# Patient Record
Sex: Female | Born: 1935 | Hispanic: Yes | Smoking: Never smoker
Health system: Southern US, Community
[De-identification: ages and names within clinical notes are randomized; demographics above are authoritative.]

## PROBLEM LIST (undated history)

## (undated) DIAGNOSIS — I10 Essential (primary) hypertension: Secondary | ICD-10-CM

## (undated) DIAGNOSIS — K219 Gastro-esophageal reflux disease without esophagitis: Secondary | ICD-10-CM

## (undated) DIAGNOSIS — G3184 Mild cognitive impairment, so stated: Secondary | ICD-10-CM

## (undated) DIAGNOSIS — C50919 Malignant neoplasm of unspecified site of unspecified female breast: Secondary | ICD-10-CM

## (undated) DIAGNOSIS — E7800 Pure hypercholesterolemia, unspecified: Secondary | ICD-10-CM

## (undated) HISTORY — PX: BREAST EXCISIONAL BIOPSY: SUR124

## (undated) HISTORY — PX: CATARACT EXTRACTION W/ INTRAOCULAR LENS  IMPLANT, BILATERAL: SHX1307

## (undated) HISTORY — PX: APPENDECTOMY: SHX54

## (undated) HISTORY — DX: Mild cognitive impairment of uncertain or unknown etiology: G31.84

## (undated) HISTORY — PX: TONSILLECTOMY: SUR1361

## (undated) HISTORY — PX: BUNIONECTOMY: SHX129

---

## 1978-01-17 DIAGNOSIS — C50919 Malignant neoplasm of unspecified site of unspecified female breast: Secondary | ICD-10-CM

## 1978-01-17 HISTORY — DX: Malignant neoplasm of unspecified site of unspecified female breast: C50.919

## 1988-01-18 HISTORY — PX: ABDOMINAL HYSTERECTOMY: SHX81

## 2010-01-18 LAB — HM COLONOSCOPY: HM Colonoscopy: NORMAL

## 2011-01-18 HISTORY — PX: ESOPHAGOGASTRODUODENOSCOPY: SHX1529

## 2013-08-26 LAB — LIPID PANEL
Cholesterol: 273 mg/dL — AB (ref 0–200)
HDL: 73 mg/dL — AB (ref 35–70)
LDL Cholesterol: 163 mg/dL
Triglycerides: 183 mg/dL — AB (ref 40–160)

## 2013-08-26 LAB — BASIC METABOLIC PANEL
BUN: 24 mg/dL — AB (ref 4–21)
Creatinine: 1.2 mg/dL — AB (ref <=1.1)

## 2013-08-26 LAB — TSH: TSH: 2.6 u[IU]/mL (ref <=5.90)

## 2013-09-05 ENCOUNTER — Ambulatory Visit: Payer: Self-pay | Admitting: Internal Medicine

## 2013-09-05 LAB — HM MAMMOGRAPHY: HM Mammogram: NORMAL

## 2014-01-22 ENCOUNTER — Ambulatory Visit: Payer: Self-pay | Admitting: Internal Medicine

## 2014-01-22 DIAGNOSIS — M810 Age-related osteoporosis without current pathological fracture: Secondary | ICD-10-CM | POA: Diagnosis not present

## 2014-01-22 DIAGNOSIS — M8580 Other specified disorders of bone density and structure, unspecified site: Secondary | ICD-10-CM | POA: Diagnosis not present

## 2014-01-22 DIAGNOSIS — Z13820 Encounter for screening for osteoporosis: Secondary | ICD-10-CM | POA: Diagnosis not present

## 2014-02-12 DIAGNOSIS — M810 Age-related osteoporosis without current pathological fracture: Secondary | ICD-10-CM | POA: Diagnosis not present

## 2014-02-12 DIAGNOSIS — I10 Essential (primary) hypertension: Secondary | ICD-10-CM | POA: Diagnosis not present

## 2014-02-12 DIAGNOSIS — H6123 Impacted cerumen, bilateral: Secondary | ICD-10-CM | POA: Diagnosis not present

## 2014-02-12 DIAGNOSIS — H918X3 Other specified hearing loss, bilateral: Secondary | ICD-10-CM | POA: Diagnosis not present

## 2014-02-17 DIAGNOSIS — H6120 Impacted cerumen, unspecified ear: Secondary | ICD-10-CM | POA: Diagnosis not present

## 2014-02-17 DIAGNOSIS — H903 Sensorineural hearing loss, bilateral: Secondary | ICD-10-CM | POA: Diagnosis not present

## 2014-03-10 DIAGNOSIS — M150 Primary generalized (osteo)arthritis: Secondary | ICD-10-CM | POA: Diagnosis not present

## 2014-03-10 DIAGNOSIS — G8929 Other chronic pain: Secondary | ICD-10-CM | POA: Diagnosis not present

## 2014-03-10 DIAGNOSIS — M25562 Pain in left knee: Secondary | ICD-10-CM | POA: Diagnosis not present

## 2014-03-10 DIAGNOSIS — M810 Age-related osteoporosis without current pathological fracture: Secondary | ICD-10-CM | POA: Diagnosis not present

## 2014-04-28 DIAGNOSIS — M810 Age-related osteoporosis without current pathological fracture: Secondary | ICD-10-CM | POA: Diagnosis not present

## 2014-06-06 DIAGNOSIS — K2270 Barrett's esophagus without dysplasia: Secondary | ICD-10-CM | POA: Diagnosis not present

## 2014-06-06 DIAGNOSIS — I10 Essential (primary) hypertension: Secondary | ICD-10-CM | POA: Diagnosis not present

## 2014-06-06 DIAGNOSIS — Z23 Encounter for immunization: Secondary | ICD-10-CM | POA: Diagnosis not present

## 2014-06-06 DIAGNOSIS — E782 Mixed hyperlipidemia: Secondary | ICD-10-CM | POA: Diagnosis not present

## 2014-06-17 ENCOUNTER — Ambulatory Visit
Admission: EM | Admit: 2014-06-17 | Discharge: 2014-06-17 | Disposition: A | Discharge status: Home / Self Care | Payer: Medicare Other | Attending: Family Medicine | Admitting: Family Medicine

## 2014-06-17 ENCOUNTER — Other Ambulatory Visit: Payer: Self-pay

## 2014-06-17 DIAGNOSIS — M25512 Pain in left shoulder: Secondary | ICD-10-CM | POA: Diagnosis not present

## 2014-06-17 DIAGNOSIS — K219 Gastro-esophageal reflux disease without esophagitis: Secondary | ICD-10-CM | POA: Insufficient documentation

## 2014-06-17 DIAGNOSIS — I10 Essential (primary) hypertension: Secondary | ICD-10-CM | POA: Diagnosis not present

## 2014-06-17 DIAGNOSIS — Z79899 Other long term (current) drug therapy: Secondary | ICD-10-CM | POA: Diagnosis not present

## 2014-06-17 DIAGNOSIS — E780 Pure hypercholesterolemia: Secondary | ICD-10-CM | POA: Diagnosis not present

## 2014-06-17 DIAGNOSIS — S46912A Strain of unspecified muscle, fascia and tendon at shoulder and upper arm level, left arm, initial encounter: Secondary | ICD-10-CM

## 2014-06-17 HISTORY — DX: Pure hypercholesterolemia, unspecified: E78.00

## 2014-06-17 HISTORY — DX: Essential (primary) hypertension: I10

## 2014-06-17 HISTORY — DX: Gastro-esophageal reflux disease without esophagitis: K21.9

## 2014-06-17 NOTE — ED Notes (Signed)
States woke Sunday Morning with left upper arm pain. Denied chest pain. No diaphoretic or SOB. Color pink, skin warm and dry

## 2014-06-17 NOTE — ED Provider Notes (Signed)
CSN: [id number]     Arrival date & time 06/17/14  1033 History   First MD Initiated Contact with Patient 06/17/14 1131     Chief Complaint  Patient presents with  . Arm Pain   (Consider location/radiation/quality/duration/timing/severity/associated sxs/prior Treatment) Patient is a 79 y.o. female presenting with arm pain. The history is provided by the patient.  Arm Pain This is a new problem. The current episode started 2 days ago (complains of waking up on Sunday with left shoulder pain which was worse with movement; states has improved some since then but not resolved; denies any chest pain or shortness of breath). The problem occurs constantly. Pertinent negatives include no chest pain, no abdominal pain, no headaches and no shortness of breath. She has tried nothing for the symptoms.    Past Medical History  Diagnosis Date  . GERD (gastroesophageal reflux disease)   . Hypertension   . Hypercholesteremia    Past Surgical History  Procedure Laterality Date  . Appendectomy    . Abdominal hysterectomy     No family history on file. History  Substance Use Topics  . Smoking status: Never Smoker   . Smokeless tobacco: Not on file  . Alcohol Use: No   OB History    No data available     Review of Systems  Respiratory: Negative for shortness of breath.   Cardiovascular: Negative for chest pain.  Gastrointestinal: Negative for abdominal pain.  Neurological: Negative for headaches.    Allergies  Review of patient's allergies indicates no known allergies.  Home Medications   Prior to Admission medications   Medication Sig Start Date End Date Taking? Authorizing Provider  ezetimibe (ZETIA) 10 MG tablet Take 10 mg by mouth daily.   Yes Historical Provider, MD  losartan-hydrochlorothiazide (HYZAAR) 100-12.5 MG per tablet Take 1 tablet by mouth daily.   Yes Historical Provider, MD  omeprazole (PRILOSEC) 20 MG capsule Take 20 mg by mouth daily.   Yes Historical Provider, MD    BP 154/78 mmHg  Pulse 75  Temp(Src) 97.3 F (36.3 C) (Tympanic)  Resp 17  Ht 5\' 2"  (1.575 m)  Wt 153 lb (69.4 kg)  BMI 27.98 kg/m2  SpO2 97% Physical Exam  Constitutional: She appears well-developed and well-nourished. No distress.  Cardiovascular: Normal rate, regular rhythm and normal heart sounds.   No murmur heard. Pulmonary/Chest: Effort normal and breath sounds normal. No respiratory distress. She has no wheezes. She has no rales. She exhibits no tenderness.  Musculoskeletal:       Left shoulder: She exhibits decreased range of motion (90 degree abduction), tenderness and pain (with movement (abduction)). She exhibits no bony tenderness, no swelling, no effusion, no crepitus, no deformity, no laceration, normal pulse and normal strength.  Skin: She is not diaphoretic.  Nursing note and vitals reviewed.   ED Course  Procedures (including critical care time) Labs Review Labs Reviewed - No data to display  Imaging Review No results found.  EKG: there are no previous tracings available for comparison, normal sinus rhythm, Q waves in III, V1, V2; reviewed by me and agree with computerized readout MDM   1. Left shoulder strain, initial encounter     Plan: 1. EKG results and diagnosis reviewed with patient 2. rx as per orders; risks, benefits, potential side effects reviewed with patient 3. Recommend supportive treatment with ROM exercises, ice, otc NSAIDS; recommend 81mg  ASA preventative due to risks factors 4. F/u with PCP for possible further work up regarding EKG findings;  Norval Gable, MD 06/17/14 [phone]

## 2014-06-21 ENCOUNTER — Encounter: Payer: Self-pay | Admitting: Internal Medicine

## 2014-06-21 ENCOUNTER — Other Ambulatory Visit: Payer: Self-pay | Admitting: Internal Medicine

## 2014-06-21 DIAGNOSIS — M1710 Unilateral primary osteoarthritis, unspecified knee: Secondary | ICD-10-CM | POA: Insufficient documentation

## 2014-06-21 DIAGNOSIS — N6019 Diffuse cystic mastopathy of unspecified breast: Secondary | ICD-10-CM | POA: Insufficient documentation

## 2014-06-21 DIAGNOSIS — K2270 Barrett's esophagus without dysplasia: Secondary | ICD-10-CM | POA: Insufficient documentation

## 2014-06-21 DIAGNOSIS — M810 Age-related osteoporosis without current pathological fracture: Secondary | ICD-10-CM | POA: Insufficient documentation

## 2014-06-21 DIAGNOSIS — E782 Mixed hyperlipidemia: Secondary | ICD-10-CM | POA: Insufficient documentation

## 2014-06-21 DIAGNOSIS — M179 Osteoarthritis of knee, unspecified: Secondary | ICD-10-CM | POA: Insufficient documentation

## 2014-06-21 DIAGNOSIS — I10 Essential (primary) hypertension: Secondary | ICD-10-CM | POA: Insufficient documentation

## 2014-06-21 DIAGNOSIS — G4700 Insomnia, unspecified: Secondary | ICD-10-CM | POA: Insufficient documentation

## 2014-08-28 ENCOUNTER — Encounter: Payer: Self-pay | Admitting: Internal Medicine

## 2014-08-29 ENCOUNTER — Encounter: Payer: Self-pay | Admitting: Family Medicine

## 2014-08-29 ENCOUNTER — Ambulatory Visit (INDEPENDENT_AMBULATORY_CARE_PROVIDER_SITE_OTHER): Payer: Medicare Other | Admitting: Family Medicine

## 2014-08-29 VITALS — BP 130/78 | HR 80 | Ht 62.0 in | Wt 150.0 lb

## 2014-08-29 DIAGNOSIS — M5431 Sciatica, right side: Secondary | ICD-10-CM

## 2014-08-29 DIAGNOSIS — M533 Sacrococcygeal disorders, not elsewhere classified: Secondary | ICD-10-CM | POA: Diagnosis not present

## 2014-08-29 MED ORDER — PREDNISONE 10 MG PO TABS: 10.0000 mg | ORAL_TABLET | Freq: Every day | ORAL | Status: DC
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
## 2014-08-29 NOTE — Patient Instructions (Signed)
Sacroiliac Joint Dysfunction The sacroiliac joint connects the lower part of the spine (the sacrum) with the bones of the pelvis. CAUSES  Sometimes, there is no obvious reason for sacroiliac joint dysfunction. Other times, it may occur   During pregnancy.  After injury, such as:  Car accidents.  Sport-related injuries.  Work-related injuries.  Due to one leg being shorter than the other.  Due to other conditions that affect the joints, such as:  Rheumatoid arthritis.  Gout.   Psoriasis.  Joint infection (septic arthritis). SYMPTOMS  Symptoms may include:  Pain in the:  Lower back.  Buttocks.  Groin.  Thighs and legs.  Difficult sitting, standing, walking, lying, bending or lifting. DIAGNOSIS  A number of tests may be used to help diagnose the cause of sacroiliac joint dysfunction, including:  Imaging tests to look for other causes of pain, including:  MRI.  CT scan.  Bone scan.  Diagnostic injection: During a special x-ray (called fluoroscopy), a needle is put into the sacroiliac joint. A numbing medicine is injected into the joint. If the pain is improved or stopped, the diagnosis of sacroiliac joint dysfunction is more likely. TREATMENT  There are a number of types of treatment used for sacroiliac joint dysfunction, including:  Only take over-the-counter or prescription medicines for pain, discomfort, or fever as directed by your caregiver.  Medications to relax muscles.  Rest. Decreasing activity can help cut down on painful muscle spasms and allow the back to heal.  Application of heat or ice to the lower back may improve muscle spasms and soothe pain.  Brace. A special back brace, called a sacroiliac belt, can help support the joint while your back is healing.  Physical therapy can help teach comfortable positions and exercises to strengthen muscles that support the sacroiliac joint.  Cortisone injections. Injections of steroid medicine into the  joint can help decrease swelling and improve pain.  Hyaluronic acid injections. This chemical improves lubrication within the sacroiliac joint, thereby decreasing pain.  Radiofrequency ablation. A special needle is placed into the joint, where it burns away nerves that are carrying pain messages from the joint.  Surgery. Because pain occurs during movement of the joint, screws and plates may be installed in order to limit or prevent joint motion. HOME CARE INSTRUCTIONS   Take all medications exactly as directed.  Follow instructions regarding both rest and physical activity, to avoid worsening the pain.  Do physical therapy exercises exactly as prescribed. SEEK IMMEDIATE MEDICAL CARE IF:  You experience increasingly severe pain.  You develop new symptoms, such as numbness or tingling in your legs or feet.  You lose bladder or bowel control. Document Released: 04/01/2008 Document Revised: 03/28/2011 Document Reviewed: 04/01/2008 [facility] Patient Information 2015 [place], [place]. This information is not intended to replace advice given to you by your health care provider. Make sure you discuss any questions you have with your health care provider.

## 2014-10-20 ENCOUNTER — Encounter: Payer: Self-pay | Admitting: Internal Medicine

## 2014-10-20 ENCOUNTER — Ambulatory Visit (INDEPENDENT_AMBULATORY_CARE_PROVIDER_SITE_OTHER): Payer: Medicare Other | Admitting: Internal Medicine

## 2014-10-20 VITALS — BP 122/70 | HR 76 | Ht 62.0 in | Wt 157.0 lb

## 2014-10-20 DIAGNOSIS — I10 Essential (primary) hypertension: Secondary | ICD-10-CM

## 2014-10-20 DIAGNOSIS — Z23 Encounter for immunization: Secondary | ICD-10-CM | POA: Diagnosis not present

## 2014-10-20 DIAGNOSIS — E782 Mixed hyperlipidemia: Secondary | ICD-10-CM | POA: Diagnosis not present

## 2014-10-20 DIAGNOSIS — Z1239 Encounter for other screening for malignant neoplasm of breast: Secondary | ICD-10-CM | POA: Diagnosis not present

## 2014-10-20 DIAGNOSIS — M1712 Unilateral primary osteoarthritis, left knee: Secondary | ICD-10-CM | POA: Diagnosis not present
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
## 2014-10-24 DIAGNOSIS — M25562 Pain in left knee: Secondary | ICD-10-CM | POA: Diagnosis not present

## 2014-10-27 ENCOUNTER — Encounter: Payer: Self-pay | Admitting: Internal Medicine

## 2014-10-27 ENCOUNTER — Encounter: Payer: Medicare Other | Admitting: Internal Medicine

## 2014-10-28 ENCOUNTER — Encounter: Payer: Self-pay | Admitting: Internal Medicine

## 2014-10-28 ENCOUNTER — Ambulatory Visit (INDEPENDENT_AMBULATORY_CARE_PROVIDER_SITE_OTHER): Payer: Medicare Other | Admitting: Internal Medicine

## 2014-10-28 VITALS — BP 148/78 | HR 68 | Ht 62.0 in | Wt 155.8 lb

## 2014-10-28 DIAGNOSIS — N952 Postmenopausal atrophic vaginitis: Secondary | ICD-10-CM

## 2014-10-28 DIAGNOSIS — I10 Essential (primary) hypertension: Secondary | ICD-10-CM | POA: Diagnosis not present

## 2014-10-28 DIAGNOSIS — K2270 Barrett's esophagus without dysplasia: Secondary | ICD-10-CM

## 2014-10-28 DIAGNOSIS — E782 Mixed hyperlipidemia: Secondary | ICD-10-CM

## 2014-10-28 DIAGNOSIS — N6019 Diffuse cystic mastopathy of unspecified breast: Secondary | ICD-10-CM | POA: Diagnosis not present

## 2014-10-28 DIAGNOSIS — M170 Bilateral primary osteoarthritis of knee: Secondary | ICD-10-CM | POA: Diagnosis not present

## 2014-10-28 LAB — POCT URINALYSIS DIPSTICK
Bilirubin, UA: NEGATIVE
Blood, UA: NEGATIVE
Glucose, UA: NEGATIVE
Ketones, UA: NEGATIVE
Leukocytes, UA: NEGATIVE
Nitrite, UA: NEGATIVE
Protein, UA: NEGATIVE
Spec Grav, UA: 1.01
Urobilinogen, UA: 0.2
pH, UA: 5

## 2014-10-28 MED ORDER — EZETIMIBE 10 MG PO TABS: 10.0000 mg | ORAL_TABLET | Freq: Every day | ORAL | Status: DC

## 2014-10-28 MED ORDER — OMEPRAZOLE 40 MG PO CPDR: 40.0000 mg | DELAYED_RELEASE_CAPSULE | Freq: Every day | ORAL | Status: DC

## 2014-10-28 MED ORDER — ESTROGENS, CONJUGATED 0.625 MG/GM VA CREA: 0.2500 | TOPICAL_CREAM | VAGINAL | Status: DC

## 2014-10-28 MED ORDER — HYDROCHLOROTHIAZIDE 12.5 MG PO TABS: 12.5000 mg | ORAL_TABLET | Freq: Every day | ORAL | Status: DC

## 2014-10-28 MED ORDER — LOSARTAN POTASSIUM 100 MG PO TABS: 100.0000 mg | ORAL_TABLET | Freq: Every day | ORAL | Status: DC

## 2014-10-28 NOTE — Progress Notes (Signed)
Patient: Evelyn Clements, Female    DOB: 03/10/1935, 79 y.o.   MRN: [id number] Visit Date: 10/28/2014  Today's Provider: Halina Maidens, MD   Chief Complaint  Patient presents with  . Medicare Wellness   Subjective:    Annual wellness visit Evelyn Clements is a 79 y.o. female who presents today for her Subsequent Annual Wellness Visit. She feels well. She reports exercising intermittently. She reports she is sleeping well. She denies any breast mass or tenderness.  She is due for mammogram - order has already been placed.  ----------------------------------------------------------- Hypertension This is a chronic problem. The current episode started more than 1 year ago. The problem is unchanged. The problem is controlled. Pertinent negatives include no chest pain, headaches, palpitations or shortness of breath. There are no associated agents to hypertension. Past treatments include angiotensin blockers and diuretics. The current treatment provides significant improvement. There are no compliance problems.  There is no history of kidney disease, CAD/MI or PVD. There is no history of chronic renal disease.  Hyperlipidemia This is a chronic problem. The problem is controlled. Recent lipid tests were reviewed and are normal. She has no history of chronic renal disease or diabetes. Pertinent negatives include no chest pain or shortness of breath. Current antihyperlipidemic treatment includes ezetimibe. The current treatment provides moderate improvement of lipids. There are no compliance problems.   Knee Pain - patient has ongoing left knee pain primarily. When a soft brace for support. She's been seen by orthopedics and they have recommended physical therapy.  Vaginal discomfort - Patient has noticed some mild vaginal irritation and some lesions on the left buttock over the past couple of months. She denies any vaginal bleeding or discharge. No pain with urination. She is not sexually active.  Review of  Systems  Constitutional: Negative for fever, diaphoresis, appetite change and fatigue.  HENT: Negative for ear discharge, ear pain, hearing loss, sore throat and trouble swallowing.   Eyes: Negative for visual disturbance.  Respiratory: Negative for cough, chest tightness and shortness of breath.   Cardiovascular: Negative for chest pain, palpitations and leg swelling.  Gastrointestinal: Negative for abdominal pain, constipation and blood in stool.  Endocrine: Negative for polydipsia and polyuria.  Genitourinary: Positive for genital sores. Negative for dysuria, hematuria, vaginal bleeding and vaginal discharge.  Musculoskeletal: Positive for arthralgias and gait problem. Negative for back pain.  Skin: Negative for color change and rash.  Neurological: Negative for seizures, weakness, light-headedness, numbness and headaches.  Hematological: Negative for adenopathy.  Psychiatric/Behavioral: Negative for sleep disturbance, dysphoric mood and decreased concentration.    Social History   Social History  . Marital Status: Single    Spouse Name: N/A  . Number of Children: N/A  . Years of Education: N/A   Occupational History  . Not on file.   Social History Main Topics  . Smoking status: Never Smoker   . Smokeless tobacco: Not on file  . Alcohol Use: No  . Drug Use: Not on file  . Sexual Activity: No   Other Topics Concern  . Not on file   Social History Narrative    Patient Active Problem List   Diagnosis Date Noted  . Barrett esophagus 06/21/2014  . Essential (primary) hypertension 06/21/2014  . Fibrocystic breast 06/21/2014  . Insomnia, persistent 06/21/2014  . Hyperlipidemia, mixed 06/21/2014  . OP (osteoporosis) 06/21/2014  . Arthritis of knee, degenerative 06/21/2014    Past Surgical History  Procedure Laterality Date  . Appendectomy    . Abdominal hysterectomy  1990    Total  . Tonsillectomy    . Breast biopsy Right   . Bunionectomy Right   .  Esophagogastroduodenoscopy  2013    Barrett's esophagus    Her family history includes Leukemia in her father; Uterine cancer in her mother.    Previous Medications   BIOFLAVONOID PRODUCTS (VITAMIN C) CHEW    Chew 1 tablet by mouth daily.   CALCIUM CARB-CHOLECALCIFEROL (CALCIUM + D3) 600-200 MG-UNIT TABS    Take 1 tablet by mouth daily.   EZETIMIBE (ZETIA) 10 MG TABLET    Take 10 mg by mouth daily.   HYDROCHLOROTHIAZIDE (HYDRODIURIL) 12.5 MG TABLET    Take 1 tablet by mouth daily.   LOSARTAN (COZAAR) 100 MG TABLET    Take 1 tablet by mouth daily.   MULTIPLE VITAMIN PO    Take 1 tablet by mouth daily.   OMEPRAZOLE (PRILOSEC) 40 MG CAPSULE    Take 1 capsule by mouth daily.    Patient Care Team: Glean Hess, MD as PCP - General (Family Medicine)     Objective:   Vitals: BP 148/78 mmHg  Pulse 68  Ht 5\' 2"  (1.575 m)  Wt 155 lb 12.8 oz (70.67 kg)  BMI 28.49 kg/m2  Physical Exam  Constitutional: She is oriented to person, place, and time. She appears well-developed and well-nourished. No distress.  HENT:  Head: Normocephalic and atraumatic.  Right Ear: Tympanic membrane and ear canal normal.  Left Ear: Tympanic membrane and ear canal normal.  Nose: Right sinus exhibits no maxillary sinus tenderness. Left sinus exhibits no maxillary sinus tenderness.  Mouth/Throat: Uvula is midline and oropharynx is clear and moist.  Eyes: Conjunctivae and EOM are normal. Right eye exhibits no discharge. Left eye exhibits no discharge. No scleral icterus.  Neck: Normal range of motion. Carotid bruit is not present. No erythema present. No thyromegaly present.  Cardiovascular: Normal rate, regular rhythm, normal heart sounds and normal pulses.   Pulmonary/Chest: Effort normal. No respiratory distress. She has no wheezes. Right breast exhibits inverted nipple. Right breast exhibits no mass, no nipple discharge, no skin change and no tenderness. Left breast exhibits no inverted nipple, no mass, no  nipple discharge, no skin change and no tenderness.  Abdominal: Soft. Bowel sounds are normal. There is no hepatosplenomegaly. There is no tenderness. There is no CVA tenderness.  Genitourinary:    Right adnexum displays no mass, no tenderness and no fullness. Left adnexum displays no mass, no tenderness and no fullness.  Musculoskeletal:       Left knee: She exhibits decreased range of motion. She exhibits no swelling and no effusion.  Lymphadenopathy:    She has no cervical adenopathy.    She has no axillary adenopathy.  Neurological: She is alert and oriented to person, place, and time. She has normal reflexes. No cranial nerve deficit or sensory deficit.  Skin: Skin is warm, dry and intact. No rash noted.  Psychiatric: She has a normal mood and affect. Her speech is normal and behavior is normal. Judgment and thought content normal. Cognition and memory are normal.  Nursing note and vitals reviewed.   Activities of Daily Living In your present state of health, do you have any difficulty performing the following activities: 08/29/2014  Hearing? N  Vision? N  Difficulty concentrating or making decisions? N  Walking or climbing stairs? N  Dressing or bathing? N  Doing errands, shopping? N    Fall Risk Assessment Fall Risk  08/29/2014  Falls  in the past year? No     Patient reports there are safety devices in place in shower at home.   Depression Screen PHQ 2/9 Scores 08/29/2014  PHQ - 2 Score 0    Cognitive Testing - 6-CIT   Correct? Score   What year is it? yes 0 Yes = 0    No = 4  What month is it? yes 0 Yes = 0    No = 3  Remember:     [place]ia [place], [place], [place]     What time is it? yes 0 Yes = 0    No = 3  Count backwards from 20 to 1 yes 0 Correct = 0    1 error = 2   More than 1 error = 4  Say the months of the year in reverse. yes 0 Correct = 0    1 error = 2   More than 1 error = 4  What address did I ask you to remember? yes 2 Correct = 0  1 error = 2     2 error = 4    3 error = 6    4 error = 8    All wrong = 10       TOTAL SCORE  26/28   Interpretation:  Normal  Normal (0-7) Abnormal (8-28)        Assessment & Plan:     Annual Wellness Visit  Reviewed patient's Family Medical History Reviewed and updated list of patient's medical providers Assessment of cognitive impairment was done Assessed patient's functional ability Established a written schedule for health screening [place] Completed and Reviewed  Exercise Activities and Dietary recommendations Goals    None      Immunization History  Administered Date(s) Administered  . Influenza,inj,Quad PF,36+ Mos 10/20/2014  . Influenza-Unspecified 10/17/2013  . Pneumococcal Conjugate-13 06/06/2014  . Pneumococcal Polysaccharide-23 01/19/2012  . Tdap 01/18/2010  . Zoster 01/19/2012    Health Maintenance  Topic Date Due  . DEXA SCAN  05/09/2002  . MAMMOGRAM  09/06/2014  . INFLUENZA VACCINE  08/18/2015  . TETANUS/TDAP  01/19/2020  . ZOSTAVAX  Completed  . PNA vac Low Risk Adult  Completed     Discussed health benefits of physical activity, and encouraged her to engage in regular exercise appropriate for her age and condition.    ------------------------------------------------------------------------------------------------------------  1. Essential (primary) hypertension Controlled; continue current regimen - CBC with Differential/Platelet - Comprehensive metabolic panel - TSH - POCT urinalysis dipstick - hydrochlorothiazide (HYDRODIURIL) 12.5 MG tablet; Take 1 tablet (12.5 mg total) by mouth daily.  Dispense: 30 tablet; Refill: 12 - losartan (COZAAR) 100 MG tablet; Take 1 tablet (100 mg total) by mouth daily.  Dispense: 30 tablet; Refill: 12  2. Barrett esophagus Patient advised to take daily PPI lifelong - CBC with Differential/Platelet - omeprazole (PRILOSEC) 40 MG capsule; Take 1 capsule (40 mg total) by mouth daily.  Dispense: 30  capsule; Refill: 12  3. Hyperlipidemia, mixed Intolerant of statin will continue as to maybe - Lipid panel - ezetimibe (ZETIA) 10 MG tablet; Take 1 tablet (10 mg total) by mouth daily.  Dispense: 30 tablet; Refill: 12  4. Fibrocystic breast, unspecified laterality Patient to schedule mammogram  5. Primary osteoarthritis of both knees Follow up with physical therapy as planned  6. Postmenopausal atrophic vaginitis Patient is reassured Begin estrogen cream topically - conjugated estrogens (PREMARIN) vaginal cream; Place 9.76 Applicatorfuls vaginally every other day.  Dispense: 42.5 g; Refill: 12   Halina Maidens, MD [facility]  10/28/2014

## 2014-10-28 NOTE — Patient Instructions (Signed)
Health Maintenance  Topic Date Due  . DEXA SCAN  05/09/2002  . MAMMOGRAM  09/06/2014  . INFLUENZA VACCINE  08/18/2015  . TETANUS/TDAP  01/19/2020  . ZOSTAVAX  Completed  . PNA vac Low Risk Adult  Completed

## 2014-10-29 DIAGNOSIS — M1712 Unilateral primary osteoarthritis, left knee: Secondary | ICD-10-CM | POA: Diagnosis not present

## 2014-10-29 DIAGNOSIS — M25562 Pain in left knee: Secondary | ICD-10-CM | POA: Diagnosis not present

## 2014-10-29 LAB — CBC WITH DIFFERENTIAL/PLATELET
Basophils Absolute: 0 10*3/uL (ref 0.0–0.2)
Basos: 0 %
EOS (ABSOLUTE): 0.1 10*3/uL (ref 0.0–0.4)
Eos: 2 %
Hematocrit: 40 % (ref 34.0–46.6)
Hemoglobin: 13 g/dL (ref 11.1–15.9)
Immature Grans (Abs): 0 10*3/uL (ref 0.0–0.1)
Immature Granulocytes: 0 %
Lymphocytes Absolute: 2.1 10*3/uL (ref 0.7–3.1)
Lymphs: 31 %
MCH: 29.8 pg (ref 26.6–33.0)
MCHC: 32.5 g/dL (ref 31.5–35.7)
MCV: 92 fL (ref 79–97)
Monocytes Absolute: 0.7 10*3/uL (ref 0.1–0.9)
Monocytes: 10 %
Neutrophils Absolute: 3.8 10*3/uL (ref 1.4–7.0)
Neutrophils: 57 %
Platelets: 229 10*3/uL (ref 150–379)
RBC: 4.36 x10E6/uL (ref 3.77–5.28)
RDW: 14.1 % (ref 12.3–15.4)
WBC: 6.7 10*3/uL (ref 3.4–10.8)

## 2014-10-29 LAB — COMPREHENSIVE METABOLIC PANEL
ALT: 18 IU/L (ref 0–32)
AST: 22 IU/L (ref 0–40)
Albumin/Globulin Ratio: 1.7 (ref 1.1–2.5)
Albumin: 4.1 g/dL (ref 3.5–4.8)
Alkaline Phosphatase: 67 IU/L (ref 39–117)
BUN/Creatinine Ratio: 21 (ref 11–26)
BUN: 16 mg/dL (ref 8–27)
Bilirubin Total: 0.3 mg/dL (ref 0.0–1.2)
CO2: 24 mmol/L (ref 18–29)
Calcium: 9.3 mg/dL (ref 8.7–10.3)
Chloride: 101 mmol/L (ref 97–108)
Creatinine, Ser: 0.76 mg/dL (ref 0.57–1.00)
GFR calc Af Amer: 88 mL/min/{1.73_m2} (ref >59)
GFR calc non Af Amer: 76 mL/min/{1.73_m2} (ref >59)
Globulin, Total: 2.4 g/dL (ref 1.5–4.5)
Glucose: 104 mg/dL — ABNORMAL HIGH (ref 65–99)
Potassium: 4.5 mmol/L (ref 3.5–5.2)
Sodium: 142 mmol/L (ref 134–144)
Total Protein: 6.5 g/dL (ref 6.0–8.5)

## 2014-10-29 LAB — LIPID PANEL
Chol/HDL Ratio: 4.1 ratio units (ref 0.0–4.4)
Cholesterol, Total: 256 mg/dL — ABNORMAL HIGH (ref 100–199)
HDL: 63 mg/dL (ref >39)
LDL Calculated: 165 mg/dL — ABNORMAL HIGH (ref 0–99)
Triglycerides: 138 mg/dL (ref 0–149)
VLDL Cholesterol Cal: 28 mg/dL (ref 5–40)

## 2014-10-29 LAB — TSH: TSH: 2.48 u[IU]/mL (ref 0.450–4.500)

## 2014-11-19 ENCOUNTER — Encounter: Payer: Self-pay | Admitting: Internal Medicine

## 2014-12-22 ENCOUNTER — Other Ambulatory Visit: Payer: Self-pay

## 2014-12-22 DIAGNOSIS — N952 Postmenopausal atrophic vaginitis: Secondary | ICD-10-CM

## 2014-12-22 MED ORDER — ESTROGENS, CONJUGATED 0.625 MG/GM VA CREA: 0.2500 | TOPICAL_CREAM | VAGINAL | Status: DC

## 2014-12-22 NOTE — Telephone Encounter (Signed)
Patient needs refill on Premarin and wants to know what to take for her knee pain.dr

## 2014-12-26 ENCOUNTER — Other Ambulatory Visit: Payer: Self-pay

## 2014-12-26 DIAGNOSIS — N952 Postmenopausal atrophic vaginitis: Secondary | ICD-10-CM

## 2014-12-26 MED ORDER — ESTROGENS, CONJUGATED 0.625 MG/GM VA CREA: 0.2500 | TOPICAL_CREAM | VAGINAL | Status: DC

## 2015-01-20 ENCOUNTER — Ambulatory Visit: Payer: Medicare Other | Admitting: Internal Medicine

## 2015-01-20 ENCOUNTER — Encounter: Payer: Medicare Other | Admitting: Internal Medicine

## 2015-01-21 ENCOUNTER — Other Ambulatory Visit: Payer: Self-pay

## 2015-01-21 DIAGNOSIS — I10 Essential (primary) hypertension: Secondary | ICD-10-CM

## 2015-01-21 DIAGNOSIS — K2270 Barrett's esophagus without dysplasia: Secondary | ICD-10-CM

## 2015-01-21 DIAGNOSIS — E782 Mixed hyperlipidemia: Secondary | ICD-10-CM

## 2015-01-21 MED ORDER — EZETIMIBE 10 MG PO TABS: 10.0000 mg | ORAL_TABLET | Freq: Every day | ORAL | Status: DC

## 2015-01-21 MED ORDER — OMEPRAZOLE 40 MG PO CPDR: 40.0000 mg | DELAYED_RELEASE_CAPSULE | Freq: Every day | ORAL | Status: DC

## 2015-01-21 MED ORDER — HYDROCHLOROTHIAZIDE 12.5 MG PO TABS: 12.5000 mg | ORAL_TABLET | Freq: Every day | ORAL | Status: DC

## 2015-01-21 MED ORDER — LOSARTAN POTASSIUM 100 MG PO TABS: 100.0000 mg | ORAL_TABLET | Freq: Every day | ORAL | Status: DC

## 2015-01-21 NOTE — Telephone Encounter (Signed)
Pharmacy sent Rx request.

## 2015-01-23 NOTE — Telephone Encounter (Signed)
pts coming in on 1/10

## 2015-01-27 ENCOUNTER — Ambulatory Visit: Payer: Medicare Other | Admitting: Internal Medicine

## 2015-02-03 ENCOUNTER — Ambulatory Visit (INDEPENDENT_AMBULATORY_CARE_PROVIDER_SITE_OTHER): Payer: Medicare Other | Admitting: Internal Medicine

## 2015-02-03 ENCOUNTER — Encounter: Payer: Self-pay | Admitting: Internal Medicine

## 2015-02-03 VITALS — BP 124/62 | HR 88 | Ht 62.0 in | Wt 156.0 lb

## 2015-02-03 DIAGNOSIS — E782 Mixed hyperlipidemia: Secondary | ICD-10-CM

## 2015-02-03 DIAGNOSIS — I10 Essential (primary) hypertension: Secondary | ICD-10-CM | POA: Diagnosis not present
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
## 2015-02-10 ENCOUNTER — Ambulatory Visit
Admission: RE | Admit: 2015-02-10 | Discharge: 2015-02-10 | Disposition: A | Discharge status: Home / Self Care | Payer: Medicare Other | Source: Ambulatory Visit | Attending: Internal Medicine | Admitting: Internal Medicine

## 2015-02-10 ENCOUNTER — Other Ambulatory Visit: Payer: Self-pay | Admitting: Internal Medicine

## 2015-02-10 DIAGNOSIS — Z1239 Encounter for other screening for malignant neoplasm of breast: Secondary | ICD-10-CM

## 2015-02-10 DIAGNOSIS — Z1231 Encounter for screening mammogram for malignant neoplasm of breast: Secondary | ICD-10-CM | POA: Insufficient documentation

## 2015-02-13 ENCOUNTER — Other Ambulatory Visit: Payer: Self-pay

## 2015-02-13 MED ORDER — ICOSAPENT ETHYL 1 G PO CAPS: 2.0000 | ORAL_CAPSULE | Freq: Two times a day (BID) | ORAL | Status: DC

## 2015-02-13 NOTE — Telephone Encounter (Signed)
Patient states that medication is working for her. Please send to pharmacy. [facility]

## 2015-02-19 ENCOUNTER — Telehealth: Payer: Self-pay

## 2015-02-19 NOTE — Telephone Encounter (Signed)
Patient states that she contacted her insurance company about Evelyn Clements and states that medication will cost her 225 dollars. She states that she can simply not afford this. Patient would like to know if she could try to take [facility] instead? Please advise.

## 2015-02-20 NOTE — Telephone Encounter (Signed)
Spoke with pt. Pt. Advised and will go by and pick up from [facility]

## 2015-02-20 NOTE — Telephone Encounter (Signed)
I do not expect her to pay that much for medication.  I recommend instead a high quality fish oil from [facility] instead. Evelyn Clements is not likely to be of much benefit.

## 2015-02-27 ENCOUNTER — Encounter: Payer: Self-pay | Admitting: Internal Medicine

## 2015-04-08 DIAGNOSIS — Z1283 Encounter for screening for malignant neoplasm of skin: Secondary | ICD-10-CM | POA: Diagnosis not present

## 2015-04-08 DIAGNOSIS — D485 Neoplasm of uncertain behavior of skin: Secondary | ICD-10-CM | POA: Diagnosis not present

## 2015-04-08 DIAGNOSIS — L728 Other follicular cysts of the skin and subcutaneous tissue: Secondary | ICD-10-CM | POA: Diagnosis not present

## 2015-04-08 DIAGNOSIS — L821 Other seborrheic keratosis: Secondary | ICD-10-CM | POA: Diagnosis not present

## 2015-04-08 DIAGNOSIS — R202 Paresthesia of skin: Secondary | ICD-10-CM | POA: Diagnosis not present

## 2015-04-08 DIAGNOSIS — L720 Epidermal cyst: Secondary | ICD-10-CM | POA: Diagnosis not present

## 2015-04-08 DIAGNOSIS — L814 Other melanin hyperpigmentation: Secondary | ICD-10-CM | POA: Diagnosis not present

## 2015-04-29 DIAGNOSIS — L728 Other follicular cysts of the skin and subcutaneous tissue: Secondary | ICD-10-CM | POA: Diagnosis not present

## 2015-04-29 DIAGNOSIS — L239 Allergic contact dermatitis, unspecified cause: Secondary | ICD-10-CM | POA: Diagnosis not present

## 2015-06-01 ENCOUNTER — Encounter: Payer: Self-pay | Admitting: Internal Medicine

## 2015-06-01 ENCOUNTER — Ambulatory Visit (INDEPENDENT_AMBULATORY_CARE_PROVIDER_SITE_OTHER): Payer: Medicare Other | Admitting: Internal Medicine

## 2015-06-01 VITALS — BP 143/78 | HR 74 | Temp 98.1°F | Resp 16 | Ht 62.0 in | Wt 157.0 lb

## 2015-06-01 DIAGNOSIS — L720 Epidermal cyst: Secondary | ICD-10-CM | POA: Diagnosis not present

## 2015-06-01 DIAGNOSIS — N907 Vulvar cyst: Secondary | ICD-10-CM | POA: Insufficient documentation

## 2015-06-01 DIAGNOSIS — L723 Sebaceous cyst: Secondary | ICD-10-CM | POA: Diagnosis not present
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
## 2015-06-01 NOTE — Patient Instructions (Signed)
Warm soaks as needed.  Can use gentle pressure to express material - otherwise no treatment is needed

## 2015-06-02 DIAGNOSIS — L298 Other pruritus: Secondary | ICD-10-CM | POA: Diagnosis not present

## 2015-06-02 DIAGNOSIS — D225 Melanocytic nevi of trunk: Secondary | ICD-10-CM | POA: Diagnosis not present

## 2015-06-02 DIAGNOSIS — L720 Epidermal cyst: Secondary | ICD-10-CM | POA: Diagnosis not present

## 2015-06-02 DIAGNOSIS — L02232 Carbuncle of back [any part, except buttock]: Secondary | ICD-10-CM | POA: Diagnosis not present

## 2015-06-02 DIAGNOSIS — L821 Other seborrheic keratosis: Secondary | ICD-10-CM | POA: Diagnosis not present

## 2015-06-11 ENCOUNTER — Other Ambulatory Visit: Payer: Self-pay | Admitting: Internal Medicine

## 2015-08-14 ENCOUNTER — Other Ambulatory Visit: Payer: Self-pay | Admitting: Internal Medicine

## 2015-09-10 ENCOUNTER — Ambulatory Visit: Payer: Medicare Other | Admitting: Internal Medicine

## 2015-09-17 ENCOUNTER — Other Ambulatory Visit: Payer: Self-pay

## 2015-10-02 ENCOUNTER — Ambulatory Visit (INDEPENDENT_AMBULATORY_CARE_PROVIDER_SITE_OTHER): Payer: Medicare Other | Admitting: Internal Medicine

## 2015-10-02 ENCOUNTER — Encounter: Payer: Self-pay | Admitting: Internal Medicine

## 2015-10-02 VITALS — BP 120/76 | HR 78 | Resp 16 | Ht 62.0 in | Wt 157.0 lb

## 2015-10-02 DIAGNOSIS — M5431 Sciatica, right side: Secondary | ICD-10-CM

## 2015-10-02 MED ORDER — METHYLPREDNISOLONE 4 MG PO TBPK: ORAL_TABLET | ORAL | 0 refills | Status: DC
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
## 2015-10-09 ENCOUNTER — Ambulatory Visit (INDEPENDENT_AMBULATORY_CARE_PROVIDER_SITE_OTHER): Payer: Medicare Other | Admitting: Internal Medicine

## 2015-10-09 ENCOUNTER — Encounter: Payer: Self-pay | Admitting: Internal Medicine

## 2015-10-09 VITALS — BP 138/80 | HR 64 | Resp 16 | Ht 62.0 in | Wt 154.0 lb

## 2015-10-09 DIAGNOSIS — H539 Unspecified visual disturbance: Secondary | ICD-10-CM | POA: Diagnosis not present

## 2015-10-09 DIAGNOSIS — Z0000 Encounter for general adult medical examination without abnormal findings: Secondary | ICD-10-CM

## 2015-10-09 DIAGNOSIS — E782 Mixed hyperlipidemia: Secondary | ICD-10-CM

## 2015-10-09 DIAGNOSIS — R21 Rash and other nonspecific skin eruption: Secondary | ICD-10-CM | POA: Diagnosis not present

## 2015-10-09 DIAGNOSIS — Z23 Encounter for immunization: Secondary | ICD-10-CM | POA: Diagnosis not present

## 2015-10-09 DIAGNOSIS — I10 Essential (primary) hypertension: Secondary | ICD-10-CM | POA: Diagnosis not present

## 2015-10-09 DIAGNOSIS — K2270 Barrett's esophagus without dysplasia: Secondary | ICD-10-CM | POA: Diagnosis not present

## 2015-10-09 LAB — POCT URINALYSIS DIPSTICK
Bilirubin, UA: NEGATIVE
Glucose, UA: NEGATIVE
Ketones, UA: NEGATIVE
Leukocytes, UA: NEGATIVE
Nitrite, UA: NEGATIVE
Protein, UA: NEGATIVE
Spec Grav, UA: 1.015
Urobilinogen, UA: 0.2
pH, UA: 6

## 2015-10-09 MED ORDER — NYSTATIN-TRIAMCINOLONE 100000-0.1 UNIT/GM-% EX OINT: 1.0000 "application " | TOPICAL_OINTMENT | Freq: Two times a day (BID) | CUTANEOUS | 0 refills | Status: DC

## 2015-10-09 NOTE — Patient Instructions (Signed)
Health Maintenance  Topic Date Due  . MAMMOGRAM  02/10/2016  . TETANUS/TDAP  01/19/2020  . INFLUENZA VACCINE  Completed  . DEXA SCAN  Completed  . ZOSTAVAX  Completed  . PNA vac Low Risk Adult  Completed     Breast Self-Awareness Practicing breast self-awareness may pick up problems early, prevent significant medical complications, and possibly save your life. By practicing breast self-awareness, you can become familiar with how your breasts look and feel and if your breasts are changing. This allows you to notice changes early. It can also offer you some reassurance that your breast health is good. One way to learn what is normal for your breasts and whether your breasts are changing is to do a breast self-exam. If you find a lump or something that was not present in the past, it is best to contact your caregiver right away. Other findings that should be evaluated by your caregiver include nipple discharge, especially if it is bloody; skin changes or reddening; areas where the skin seems to be pulled in (retracted); or new lumps and bumps. Breast pain is seldom associated with cancer (malignancy), but should also be evaluated by a caregiver. HOW TO PERFORM A BREAST SELF-EXAM The best time to examine your breasts is 5-7 days after your menstrual period is over. During menstruation, the breasts are lumpier, and it may be more difficult to pick up changes. If you do not menstruate, have reached menopause, or had your uterus removed (hysterectomy), you should examine your breasts at regular intervals, such as monthly. If you are breastfeeding, examine your breasts after a feeding or after using a breast pump. Breast implants do not decrease the risk for lumps or tumors, so continue to perform breast self-exams as recommended. Talk to your caregiver about how to determine the difference between the implant and breast tissue. Also, talk about the amount of pressure you should use during the exam. Over  time, you will become more familiar with the variations of your breasts and more comfortable with the exam. A breast self-exam requires you to remove all your clothes above the waist. 1. Look at your breasts and nipples. Stand in front of a mirror in a room with good lighting. With your hands on your hips, push your hands firmly downward. Look for a difference in shape, contour, and size from one breast to the other (asymmetry). Asymmetry includes puckers, dips, or bumps. Also, look for skin changes, such as reddened or scaly areas on the breasts. Look for nipple changes, such as discharge, dimpling, repositioning, or redness. 2. Carefully feel your breasts. This is best done either in the shower or tub while using soapy water or when flat on your back. Place the arm (on the side of the breast you are examining) above your head. Use the pads (not the fingertips) of your three middle fingers on your opposite hand to feel your breasts. Start in the underarm area and use  inch (2 cm) overlapping circles to feel your breast. Use 3 different levels of pressure (light, medium, and firm pressure) at each circle before moving to the next circle. The light pressure is needed to feel the tissue closest to the skin. The medium pressure will help to feel breast tissue a little deeper, while the firm pressure is needed to feel the tissue close to the ribs. Continue the overlapping circles, moving downward over the breast until you feel your ribs below your breast. Then, move one finger-width towards the center of  the body. Continue to use the  inch (2 cm) overlapping circles to feel your breast as you move slowly up toward the collar bone (clavicle) near the base of the neck. Continue the up and down exam using all 3 pressures until you reach the middle of the chest. Do this with each breast, carefully feeling for lumps or changes. 3.  Keep a written record with breast changes or normal findings for each breast. By writing  this information down, you do not need to depend only on memory for size, tenderness, or location. Write down where you are in your menstrual cycle, if you are still menstruating. Breast tissue can have some lumps or thick tissue. However, see your caregiver if you find anything that concerns you.  SEEK MEDICAL CARE IF:  You see a change in shape, contour, or size of your breasts or nipples.   You see skin changes, such as reddened or scaly areas on the breasts or nipples.   You have an unusual discharge from your nipples.   You feel a new lump or unusually thick areas.    This information is not intended to replace advice given to you by your health care provider. Make sure you discuss any questions you have with your health care provider.   Document Released: 01/03/2005 Document Revised: 12/21/2011 Document Reviewed: 04/20/2011 Elsevier Interactive Patient Education [employer].

## 2015-10-09 NOTE — Progress Notes (Signed)
Patient: Evelyn Clements, Female    DOB: [birth year], 80 y.o.   MRN: [id number] Visit Date: 10/09/2015  Today's Provider: Halina Maidens, MD   Chief Complaint  Patient presents with  . Medicare Wellness  . Hypertension  . Hyperlipidemia   Subjective:    Annual wellness visit Evelyn Clements is a 80 y.o. female who presents today for her Subsequent Annual Wellness Visit. She feels fairly well. She reports exercising regularly. She reports she is sleeping fairly well.  She is up to date on mammogram and pneumonia vaccines. ----------------------------------------------------------- Hypertension  This is a chronic problem. The current episode started more than 1 year ago. The problem is unchanged. The problem is controlled. Pertinent negatives include no chest pain, headaches, palpitations or shortness of breath. Past treatments include ACE inhibitors and diuretics.  Hyperlipidemia  This is a chronic problem. Pertinent negatives include no chest pain or shortness of breath. Treatments tried: EPA. The current treatment provides mild improvement of lipids.  Vision change - she is seeing wavy lines intermittently from her right eye.  No headache or dizziness. She had bilateral eye surgery - presumably cataracts with lens implants about 5 yrs ago in [place]. Low back pain - seen a week ago with sx.  Treated with prednisone taper with 95% improvement.   Barrett's Esophagus - no current sx taking daily PPI. Probably due for follow up EGD.  Review of Systems  Constitutional: Negative for chills, fatigue and fever.  HENT: Negative for congestion, hearing loss, tinnitus, trouble swallowing and voice change.   Eyes: Positive for visual disturbance.  Respiratory: Negative for cough, chest tightness, shortness of breath and wheezing.   Cardiovascular: Negative for chest pain, palpitations and leg swelling.  Gastrointestinal: Negative for abdominal pain, constipation, diarrhea and vomiting.  Endocrine: Negative  for polydipsia and polyuria.  Genitourinary: Negative for dysuria, frequency, genital sores, vaginal bleeding and vaginal discharge.  Musculoskeletal: Positive for arthralgias. Negative for gait problem and joint swelling.  Skin: Positive for rash (in rectal region from recent diarrhea). Negative for color change.  Neurological: Negative for dizziness, tremors, light-headedness and headaches.  Hematological: Negative for adenopathy. Does not bruise/bleed easily.  Psychiatric/Behavioral: Negative for dysphoric mood and sleep disturbance. The patient is not nervous/anxious.     Social History   Social History  . Marital status: Single    Spouse name: N/A  . Number of children: N/A  . Years of education: N/A   Occupational History  . Not on file.   Social History Main Topics  . Smoking status: Never Smoker  . Smokeless tobacco: Never Used  . Alcohol use No  . Drug use: Unknown  . Sexual activity: No   Other Topics Concern  . Not on file   Social History Narrative  . No narrative on file    Patient Active Problem List   Diagnosis Date Noted  . Sebaceous cyst of labia 06/01/2015  . Epidermal inclusion cyst 06/01/2015  . Barrett esophagus 06/21/2014  . Essential (primary) hypertension 06/21/2014  . Fibrocystic breast 06/21/2014  . Insomnia, persistent 06/21/2014  . Hyperlipidemia, mixed 06/21/2014  . OP (osteoporosis) 06/21/2014  . Arthritis of knee, degenerative 06/21/2014    Past Surgical History:  Procedure Laterality Date  . ABDOMINAL HYSTERECTOMY  1990   Total  . APPENDECTOMY    . BREAST EXCISIONAL BIOPSY Right 1980's   lumpectomy lobular ca pt stated no chemo no rad  . BUNIONECTOMY Right   . ESOPHAGOGASTRODUODENOSCOPY  2013   Barrett's esophagus  .  TONSILLECTOMY      Her family history includes Leukemia in her father; Uterine cancer in her mother.    Previous Medications   BIOFLAVONOID PRODUCTS (VITAMIN C) CHEW    Chew 1 tablet by mouth daily.   CALCIUM  CARB-CHOLECALCIFEROL (CALCIUM + D3) 600-200 MG-UNIT TABS    Take 1 tablet by mouth daily.   HYDROCHLOROTHIAZIDE (HYDRODIURIL) 12.5 MG TABLET    Take 1 tablet by mouth  daily   ICOSAPENT ETHYL (VASCEPA) 1 G CAPS    Take 2 capsules by mouth 2 (two) times daily.   LOSARTAN (COZAAR) 100 MG TABLET    Take 1 tablet by mouth  daily   MULTIPLE VITAMIN PO    Take 1 tablet by mouth daily.   OMEPRAZOLE (PRILOSEC) 40 MG CAPSULE    Take 1 capsule by mouth  daily   TRIAMCINOLONE CREAM (KENALOG) 0.1 %        Patient Care Team: Glean Hess, MD as PCP - General (Family Medicine)     Objective:   Vitals: BP 138/80   Pulse 64   Resp 16   Ht 5\' 2"  (1.575 m)   Wt 154 lb (69.9 kg)   SpO2 100%   BMI 28.17 kg/m   Physical Exam  Constitutional: She is oriented to person, place, and time. She appears well-developed and well-nourished. No distress.  HENT:  Head: Normocephalic and atraumatic.  Right Ear: Tympanic membrane and ear canal normal.  Left Ear: Tympanic membrane and ear canal normal.  Nose: Right sinus exhibits no maxillary sinus tenderness. Left sinus exhibits no maxillary sinus tenderness.  Mouth/Throat: Uvula is midline and oropharynx is clear and moist.  Eyes: Conjunctivae and EOM are normal. Pupils are equal, round, and reactive to light. Right eye exhibits no discharge. Left eye exhibits no discharge. No scleral icterus.  Neck: Normal range of motion. Carotid bruit is not present. No erythema present. No thyromegaly present.  Cardiovascular: Normal rate, regular rhythm, normal heart sounds and normal pulses.   Pulmonary/Chest: Effort normal. No respiratory distress. She has no wheezes. Right breast exhibits inverted nipple. Right breast exhibits no mass, no nipple discharge, no skin change and no tenderness. Left breast exhibits no inverted nipple, no mass, no nipple discharge, no skin change and no tenderness.  Abdominal: Soft. Bowel sounds are normal. There is no hepatosplenomegaly.  There is no tenderness. There is no CVA tenderness.  Musculoskeletal: Normal range of motion.       Right hip: She exhibits tenderness. She exhibits normal range of motion and normal strength.  Lymphadenopathy:    She has no cervical adenopathy.    She has no axillary adenopathy.  Neurological: She is alert and oriented to person, place, and time. She has normal reflexes. No cranial nerve deficit or sensory deficit. Coordination and gait normal.  Skin: Skin is warm and dry. Rash (not examined) noted.  Psychiatric: She has a normal mood and affect. Her speech is normal and behavior is normal. Thought content normal. Cognition and memory are normal.  Nursing note and vitals reviewed.   Activities of Daily Living In your present state of health, do you have any difficulty performing the following activities: 10/09/2015 10/02/2015  Hearing? N N  Vision? Y N  Difficulty concentrating or making decisions? N N  Walking or climbing stairs? N N  Dressing or bathing? N N  Doing errands, shopping? N N  Preparing Food and eating ? N -  Using the Toilet? N -  In the  past six months, have you accidently leaked urine? N -  Do you have problems with loss of bowel control? N -  Managing your Medications? N -  Managing your Finances? N -  Housekeeping or managing your Housekeeping? N -  Some recent data might be hidden    Fall Risk Assessment Fall Risk  10/09/2015 10/02/2015 08/29/2014  Falls in the past year? No No No      Depression Screen PHQ 2/9 Scores 10/09/2015 10/02/2015 08/29/2014  PHQ - 2 Score 0 0 0    Cognitive Testing - 6-CIT   Correct? Score   What year is it? yes 0 Yes = 0    No = 4  What month is it? yes 0 Yes = 0    No = 3  Remember:     [place]ia [place], [place], [place]     What time is it? yes 0 Yes = 0    No = 3  Count backwards from 20 to 1 yes 0 Correct = 0    1 error = 2   More than 1 error = 4  Say the months of the year in reverse. yes 0 Correct = 0    1 error = 2    More than 1 error = 4  What address did I ask you to remember? yes 0 Correct = 0  1 error = 2    2 error = 4    3 error = 6    4 error = 8    All wrong = 10       TOTAL SCORE  0/28   Interpretation:  Normal  Normal (0-7) Abnormal (8-28)      Medicare Annual Wellness Visit Summary:  Reviewed patient's Family Medical History Reviewed and updated list of patient's medical providers Assessment of cognitive impairment was done Assessed patient's functional ability Established a written schedule for health screening [place] Completed and Reviewed  Exercise Activities and Dietary recommendations Goals    . Weight (lb) < 200 lb (90.7 kg)          Wants to lose stomach        Immunization History  Administered Date(s) Administered  . Influenza,inj,Quad PF,36+ Mos 10/20/2014  . Influenza-Unspecified 10/17/2013  . Pneumococcal Conjugate-13 06/06/2014  . Pneumococcal Polysaccharide-23 01/19/2012  . Tdap 01/18/2010  . Zoster 01/19/2012    Health Maintenance  Topic Date Due  . MAMMOGRAM  02/10/2016  . TETANUS/TDAP  01/19/2020  . INFLUENZA VACCINE  Completed  . DEXA SCAN  Completed  . ZOSTAVAX  Completed  . PNA vac Low Risk Adult  Completed     Discussed health benefits of physical activity, and encouraged her to engage in regular exercise appropriate for her age and condition.    ------------------------------------------------------------------------------------------------------------   Assessment & Plan:  1. Medicare annual wellness visit, subsequent Measures satisfied  2. Barrett esophagus Continue PPI GI referral - CBC with Differential/Platelet  3. Hyperlipidemia, mixed On EPA due to statin intolerance - Lipid panel  4. Essential (primary) hypertension controlled - Comprehensive metabolic panel - TSH - POCT urinalysis dipstick  5. Rash - nystatin-triamcinolone ointment (MYCOLOG); Apply 1 application topically 2 (two) times daily.   Dispense: 30 g; Refill: 0  6. Need for influenza vaccination - Flu Vaccine QUAD 36+ mos IM  7. Visual disturbance - Ambulatory referral to Ophthalmology  8.  Low back pain with sciatica Resolving  Halina Maidens, MD [facility]  10/09/2015

## 2015-10-10 LAB — COMPREHENSIVE METABOLIC PANEL
ALT: 18 IU/L (ref 0–32)
AST: 19 IU/L (ref 0–40)
Albumin/Globulin Ratio: 1.6 (ref 1.2–2.2)
Albumin: 4.1 g/dL (ref 3.5–4.8)
Alkaline Phosphatase: 72 IU/L (ref 39–117)
BUN/Creatinine Ratio: 19 (ref 12–28)
BUN: 17 mg/dL (ref 8–27)
Bilirubin Total: 0.3 mg/dL (ref 0.0–1.2)
CO2: 25 mmol/L (ref 18–29)
Calcium: 9.3 mg/dL (ref 8.7–10.3)
Chloride: 99 mmol/L (ref 96–106)
Creatinine, Ser: 0.89 mg/dL (ref 0.57–1.00)
GFR calc Af Amer: 72 mL/min/{1.73_m2} (ref >59)
GFR calc non Af Amer: 62 mL/min/{1.73_m2} (ref >59)
Globulin, Total: 2.5 g/dL (ref 1.5–4.5)
Glucose: 75 mg/dL (ref 65–99)
Potassium: 4.3 mmol/L (ref 3.5–5.2)
Sodium: 140 mmol/L (ref 134–144)
Total Protein: 6.6 g/dL (ref 6.0–8.5)

## 2015-10-10 LAB — LIPID PANEL
Chol/HDL Ratio: 3.5 ratio units (ref 0.0–4.4)
Cholesterol, Total: 308 mg/dL — ABNORMAL HIGH (ref 100–199)
HDL: 87 mg/dL (ref >39)
LDL Calculated: 200 mg/dL — ABNORMAL HIGH (ref 0–99)
Triglycerides: 103 mg/dL (ref 0–149)
VLDL Cholesterol Cal: 21 mg/dL (ref 5–40)

## 2015-10-10 LAB — CBC WITH DIFFERENTIAL/PLATELET
Basophils Absolute: 0.1 10*3/uL (ref 0.0–0.2)
Basos: 1 %
EOS (ABSOLUTE): 0.2 10*3/uL (ref 0.0–0.4)
Eos: 2 %
Hematocrit: 36.8 % (ref 34.0–46.6)
Hemoglobin: 11.7 g/dL (ref 11.1–15.9)
Immature Grans (Abs): 0 10*3/uL (ref 0.0–0.1)
Immature Granulocytes: 0 %
Lymphocytes Absolute: 4.1 10*3/uL — ABNORMAL HIGH (ref 0.7–3.1)
Lymphs: 39 %
MCH: 26.2 pg — ABNORMAL LOW (ref 26.6–33.0)
MCHC: 31.8 g/dL (ref 31.5–35.7)
MCV: 83 fL (ref 79–97)
Monocytes Absolute: 0.9 10*3/uL (ref 0.1–0.9)
Monocytes: 8 %
Neutrophils Absolute: 5.3 10*3/uL (ref 1.4–7.0)
Neutrophils: 50 %
Platelets: 321 10*3/uL (ref 150–379)
RBC: 4.46 x10E6/uL (ref 3.77–5.28)
RDW: 15.2 % (ref 12.3–15.4)
WBC: 10.6 10*3/uL (ref 3.4–10.8)

## 2015-10-10 LAB — TSH: TSH: 3.28 u[IU]/mL (ref 0.450–4.500)

## 2015-10-16 ENCOUNTER — Other Ambulatory Visit: Payer: Self-pay | Admitting: Internal Medicine

## 2015-10-16 ENCOUNTER — Telehealth: Payer: Self-pay

## 2015-10-16 MED ORDER — NYSTATIN 100000 UNIT/GM EX OINT
1.0000 | TOPICAL_OINTMENT | Freq: Two times a day (BID) | CUTANEOUS | 0 refills | Status: DC
Start: 2015-10-16 — End: 2016-08-31

## 2015-10-16 NOTE — Telephone Encounter (Signed)
Done

## 2015-10-16 NOTE — Telephone Encounter (Signed)
Can we call in diff cream or ointment as Nystatin not covered?

## 2015-10-19 ENCOUNTER — Telehealth: Payer: Self-pay

## 2015-10-19 NOTE — Telephone Encounter (Signed)
Patient called very nervous. She said [facility] office called and stated she has a procedure coming up 11/24/2015 on esophagus and she does not remember hearing about this at all or agreeing to a procedure. Why is the GI referral now a procedure? I asked her to call GI and she asked me to find out.

## 2015-10-19 NOTE — Telephone Encounter (Signed)
The appointment 11/24/15 is at 1:45 PM.  It is not for a procedure as far as I can see from the chart.  They would never schedule a procedure without an OV first.

## 2015-10-20 ENCOUNTER — Telehealth: Payer: Self-pay

## 2015-10-20 DIAGNOSIS — H35721 Serous detachment of retinal pigment epithelium, right eye: Secondary | ICD-10-CM | POA: Diagnosis not present

## 2015-10-20 NOTE — Telephone Encounter (Signed)
Left message about why she is going to [place]. Follow up from May appt for Cyst.

## 2015-10-21 DIAGNOSIS — Z872 Personal history of diseases of the skin and subcutaneous tissue: Secondary | ICD-10-CM | POA: Diagnosis not present

## 2015-10-21 DIAGNOSIS — Z1283 Encounter for screening for malignant neoplasm of skin: Secondary | ICD-10-CM | POA: Diagnosis not present

## 2015-10-21 DIAGNOSIS — L249 Irritant contact dermatitis, unspecified cause: Secondary | ICD-10-CM | POA: Diagnosis not present

## 2015-10-27 ENCOUNTER — Encounter: Payer: Self-pay | Admitting: Internal Medicine

## 2015-10-27 ENCOUNTER — Ambulatory Visit (INDEPENDENT_AMBULATORY_CARE_PROVIDER_SITE_OTHER): Payer: Medicare Other | Admitting: Internal Medicine

## 2015-10-27 VITALS — BP 142/80 | HR 79 | Resp 16 | Ht 62.0 in | Wt 153.0 lb

## 2015-10-27 DIAGNOSIS — K22719 Barrett's esophagus with dysplasia, unspecified: Secondary | ICD-10-CM | POA: Diagnosis not present

## 2015-10-27 DIAGNOSIS — H6123 Impacted cerumen, bilateral: Secondary | ICD-10-CM

## 2015-10-27 DIAGNOSIS — I10 Essential (primary) hypertension: Secondary | ICD-10-CM
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
## 2015-10-27 NOTE — Patient Instructions (Signed)
Debrox Ear wax removal kit - use daily until appt next week

## 2015-10-29 ENCOUNTER — Ambulatory Visit: Payer: Medicare Other | Admitting: Internal Medicine

## 2015-11-02 ENCOUNTER — Ambulatory Visit: Payer: Medicare Other | Admitting: Internal Medicine

## 2015-11-03 ENCOUNTER — Encounter: Payer: Self-pay | Admitting: Internal Medicine

## 2015-11-03 ENCOUNTER — Ambulatory Visit (INDEPENDENT_AMBULATORY_CARE_PROVIDER_SITE_OTHER): Payer: Medicare Other | Admitting: Internal Medicine

## 2015-11-03 VITALS — BP 124/84 | HR 95 | Resp 16 | Ht 62.0 in | Wt 153.0 lb

## 2015-11-03 DIAGNOSIS — H6123 Impacted cerumen, bilateral: Secondary | ICD-10-CM

## 2015-11-03 DIAGNOSIS — I10 Essential (primary) hypertension: Secondary | ICD-10-CM | POA: Diagnosis not present
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
## 2015-11-24 ENCOUNTER — Other Ambulatory Visit: Payer: Self-pay

## 2015-11-24 ENCOUNTER — Ambulatory Visit (INDEPENDENT_AMBULATORY_CARE_PROVIDER_SITE_OTHER): Payer: Medicare Other | Admitting: Gastroenterology

## 2015-11-24 ENCOUNTER — Encounter: Payer: Self-pay | Admitting: Gastroenterology

## 2015-11-24 VITALS — BP 129/70 | HR 75 | Temp 97.9°F | Ht 62.0 in | Wt 156.8 lb

## 2015-11-24 DIAGNOSIS — K22719 Barrett's esophagus with dysplasia, unspecified: Secondary | ICD-10-CM | POA: Diagnosis not present

## 2015-11-24 NOTE — Progress Notes (Signed)
Gastroenterology Consultation  Referring Provider:     Glean Hess, MD Primary Care Physician:  Halina Maidens, MD Primary Gastroenterologist:  Dr. Allen Norris     Reason for Consultation:     Barrett's esophagus        HPI:   Evelyn Clements is a 80 y.o. y/o female referred for consultation & management of Barrett's esophagus by Dr. Halina Maidens, MD.  This patient comes in today with a history of Barrett's esophagus. The patient reports that she thinks her last upper endoscopy was 3-4 years ago. The patient used to live in [place] and now she has moved here. The patient also reports she had a colonoscopy at that time and is not in need of a repeat colonoscopy. The patient denies any dysphagia. The patient does report that she lays down within 15 minutes of eating she will have heartburn but otherwise has no acid breakthrough. The patient is presently on omeprazole.  Past Medical History:  Diagnosis Date  . GERD (gastroesophageal reflux disease)   . Hypercholesteremia   . Hypertension     Past Surgical History:  Procedure Laterality Date  . ABDOMINAL HYSTERECTOMY  1990   Total  . APPENDECTOMY    . BREAST EXCISIONAL BIOPSY Right 1980's   lumpectomy lobular ca pt stated no chemo no rad  . BUNIONECTOMY Right   . ESOPHAGOGASTRODUODENOSCOPY  2013   Barrett's esophagus  . TONSILLECTOMY      Prior to Admission medications   Medication Sig Start Date End Date Taking? Authorizing Provider  Bioflavonoid Products (VITAMIN C) CHEW Chew 1 tablet by mouth daily.    Historical Provider, MD  Calcium Carb-Cholecalciferol (CALCIUM + D3) 600-200 MG-UNIT TABS Take 1 tablet by mouth daily.    Historical Provider, MD  hydrochlorothiazide (HYDRODIURIL) 12.5 MG tablet Take 1 tablet by mouth  daily 08/14/15   Glean Hess, MD  Icosapent Ethyl (VASCEPA) 1 g CAPS Take 2 capsules by mouth 2 (two) times daily. 02/13/15   Glean Hess, MD  losartan (COZAAR) 100 MG tablet Take 1 tablet by mouth   daily 08/14/15   Glean Hess, MD  mometasone (ELOCON) 0.1 % ointment  10/21/15   Historical Provider, MD  MULTIPLE VITAMIN PO Take 1 tablet by mouth daily.    Historical Provider, MD  nystatin ointment (MYCOSTATIN) Apply 1 application topically 2 (two) times daily. 10/16/15   Glean Hess, MD  omeprazole (PRILOSEC) 40 MG capsule Take 1 capsule by mouth  daily 08/14/15   Glean Hess, MD  triamcinolone cream (KENALOG) 0.1 %  04/08/15   Historical Provider, MD    Family History  Problem Relation Age of Onset  . Leukemia Father   . Uterine cancer Mother      Social History  Substance Use Topics  . Smoking status: Never Smoker  . Smokeless tobacco: Never Used  . Alcohol use No    Allergies as of 11/24/2015 - Review Complete 11/24/2015  Allergen Reaction Noted  . Zolpidem tartrate  06/21/2014    Review of Systems:    All systems reviewed and negative except where noted in HPI.   Physical Exam:  BP 129/70   Pulse 75   Temp 97.9 F (36.6 C) (Oral)   Ht 5\' 2"  (1.575 m)   Wt 156 lb 12.8 oz (71.1 kg)   BMI 28.68 kg/m  No LMP recorded. Patient has had a hysterectomy. Psych:  Alert and cooperative. Normal mood and affect. General:   Alert,  Well-developed, well-nourished, pleasant and cooperative in NAD Head:  Normocephalic and atraumatic. Eyes:  Sclera clear, no icterus.   Conjunctiva pink. Ears:  Normal auditory acuity. Nose:  No deformity, discharge, or lesions. Mouth:  No deformity or lesions,oropharynx pink & moist. Neck:  Supple; no masses or thyromegaly. Lungs:  Respirations even and unlabored.  Clear throughout to auscultation.   No wheezes, crackles, or rhonchi. No acute distress. Heart:  Regular rate and rhythm; no murmurs, clicks, rubs, or gallops. Abdomen:  Normal bowel sounds.  No bruits.  Soft, non-tender and non-distended without masses, hepatosplenomegaly or hernias noted.  No guarding or rebound tenderness.  Negative Carnett sign.   Rectal:  Deferred.    Msk:  Symmetrical without gross deformities.  Good, equal movement & strength bilaterally. Pulses:  Normal pulses noted. Extremities:  No clubbing or edema.  No cyanosis. Neurologic:  Alert and oriented x3;  grossly normal neurologically. Skin:  Intact without significant lesions or rashes.  No jaundice. Lymph Nodes:  No significant cervical adenopathy. Psych:  Alert and cooperative. Normal mood and affect.  Imaging Studies: No results found.  Assessment and Plan:   Evelyn Clements is a 80 y.o. y/o female who comes in today with a history of Barrett's esophagus. The patient has not had an upper endoscopy the last couple of years. The patient be set up for an EGD to look for any dysplasia with her Barrett's.I have discussed risks & benefits which include, but are not limited to, bleeding, infection, perforation & drug reaction.  The patient agrees with this plan & written consent will be obtained.       Lucilla Lame, MD. Marval Regal   Note: This dictation was prepared with Dragon dictation along with smaller phrase technology. Any transcriptional errors that result from this process are unintentional.

## 2015-11-26 ENCOUNTER — Telehealth: Payer: Self-pay | Admitting: Gastroenterology

## 2015-11-26 NOTE — Telephone Encounter (Signed)
Patient would like to reschedule the EGD scheduled with Dr Allen Norris on 12/03/15. She does not have transportation.

## 2015-11-27 NOTE — Telephone Encounter (Signed)
Rescheduled to 12/14/2015

## 2015-12-07 ENCOUNTER — Encounter: Payer: Self-pay | Admitting: *Deleted

## 2015-12-09 NOTE — Discharge Instructions (Signed)

## 2015-12-14 ENCOUNTER — Ambulatory Visit: Payer: Medicare Other | Admitting: Anesthesiology

## 2015-12-14 ENCOUNTER — Encounter
Admission: RE | Disposition: A | Discharge status: Home / Self Care | Payer: Self-pay | Source: Ambulatory Visit | Attending: Gastroenterology

## 2015-12-14 ENCOUNTER — Ambulatory Visit
Admission: RE | Admit: 2015-12-14 | Discharge: 2015-12-14 | Disposition: A | Discharge status: Home / Self Care | Payer: Medicare Other | Source: Ambulatory Visit | Attending: Gastroenterology | Admitting: Gastroenterology

## 2015-12-14 DIAGNOSIS — Z888 Allergy status to other drugs, medicaments and biological substances status: Secondary | ICD-10-CM | POA: Insufficient documentation

## 2015-12-14 DIAGNOSIS — I10 Essential (primary) hypertension: Secondary | ICD-10-CM | POA: Insufficient documentation

## 2015-12-14 DIAGNOSIS — Z90710 Acquired absence of both cervix and uterus: Secondary | ICD-10-CM | POA: Diagnosis not present

## 2015-12-14 DIAGNOSIS — K2270 Barrett's esophagus without dysplasia: Secondary | ICD-10-CM | POA: Diagnosis not present

## 2015-12-14 DIAGNOSIS — Z9849 Cataract extraction status, unspecified eye: Secondary | ICD-10-CM | POA: Insufficient documentation

## 2015-12-14 DIAGNOSIS — Z13810 Encounter for screening for upper gastrointestinal disorder: Secondary | ICD-10-CM

## 2015-12-14 DIAGNOSIS — E7800 Pure hypercholesterolemia, unspecified: Secondary | ICD-10-CM | POA: Diagnosis not present

## 2015-12-14 DIAGNOSIS — Z806 Family history of leukemia: Secondary | ICD-10-CM | POA: Insufficient documentation

## 2015-12-14 DIAGNOSIS — Z8049 Family history of malignant neoplasm of other genital organs: Secondary | ICD-10-CM | POA: Insufficient documentation

## 2015-12-14 DIAGNOSIS — K449 Diaphragmatic hernia without obstruction or gangrene: Secondary | ICD-10-CM

## 2015-12-14 DIAGNOSIS — Z79899 Other long term (current) drug therapy: Secondary | ICD-10-CM | POA: Diagnosis not present

## 2015-12-14 DIAGNOSIS — K22719 Barrett's esophagus with dysplasia, unspecified: Secondary | ICD-10-CM

## 2015-12-14 DIAGNOSIS — K219 Gastro-esophageal reflux disease without esophagitis: Secondary | ICD-10-CM | POA: Insufficient documentation

## 2015-12-14 HISTORY — PX: ESOPHAGOGASTRODUODENOSCOPY (EGD) WITH PROPOFOL: SHX5813

## 2015-12-14 SURGERY — ESOPHAGOGASTRODUODENOSCOPY (EGD) WITH PROPOFOL
Anesthesia: Monitor Anesthesia Care | Wound class: Clean Contaminated

## 2015-12-14 MED ORDER — PROPOFOL 10 MG/ML IV BOLUS
INTRAVENOUS | Status: DC | PRN
  Administered 2015-12-14: 70 mg via INTRAVENOUS
  Administered 2015-12-14: 20 mg via INTRAVENOUS
  Administered 2015-12-14: 30 mg via INTRAVENOUS

## 2015-12-14 MED ORDER — LIDOCAINE HCL (CARDIAC) 20 MG/ML IV SOLN
INTRAVENOUS | Status: DC | PRN
  Administered 2015-12-14: 40 mg via INTRAVENOUS

## 2015-12-14 MED ORDER — LACTATED RINGERS IV SOLN
INTRAVENOUS | Status: DC
  Administered 2015-12-14: 09:00:00 via INTRAVENOUS

## 2015-12-14 MED ORDER — GLYCOPYRROLATE 0.2 MG/ML IJ SOLN
INTRAMUSCULAR | Status: DC | PRN
  Administered 2015-12-14: 0.2 mg via INTRAVENOUS

## 2015-12-14 SURGICAL SUPPLY — 32 items
BALLN DILATOR 10-12 8 (BALLOONS)
BALLN DILATOR 12-15 8 (BALLOONS)
BALLN DILATOR 15-18 8 (BALLOONS)
BALLN DILATOR CRE 0-12 8 (BALLOONS)
BALLN DILATOR ESOPH 8 10 CRE (MISCELLANEOUS) IMPLANT
BALLOON DILATOR 12-15 8 (BALLOONS) IMPLANT
BALLOON DILATOR 15-18 8 (BALLOONS) IMPLANT
BALLOON DILATOR CRE 0-12 8 (BALLOONS) IMPLANT
BLOCK BITE 60FR ADLT L/F GRN (MISCELLANEOUS) ×2 IMPLANT
CANISTER SUCT 1200ML W/VALVE (MISCELLANEOUS) ×2 IMPLANT
CLIP HMST 235XBRD CATH ROT (MISCELLANEOUS) IMPLANT
CLIP RESOLUTION 360 11X235 (MISCELLANEOUS)
FCP ESCP3.2XJMB 240X2.8X (MISCELLANEOUS)
FORCEPS BIOP RAD 4 LRG CAP 4 (CUTTING FORCEPS) ×2 IMPLANT
FORCEPS BIOP RJ4 240 W/NDL (MISCELLANEOUS)
FORCEPS ESCP3.2XJMB 240X2.8X (MISCELLANEOUS) IMPLANT
GOWN CVR UNV OPN BCK APRN NK (MISCELLANEOUS) ×2 IMPLANT
GOWN ISOL THUMB LOOP REG UNIV (MISCELLANEOUS) ×2
INJECTOR VARIJECT VIN23 (MISCELLANEOUS) IMPLANT
KIT DEFENDO VALVE AND CONN (KITS) IMPLANT
KIT ENDO PROCEDURE OLY (KITS) ×2 IMPLANT
MARKER SPOT ENDO TATTOO 5ML (MISCELLANEOUS) IMPLANT
PAD GROUND ADULT SPLIT (MISCELLANEOUS) IMPLANT
RETRIEVER NET PLAT FOOD (MISCELLANEOUS) IMPLANT
SNARE SHORT THROW 13M SML OVAL (MISCELLANEOUS) IMPLANT
SNARE SHORT THROW 30M LRG OVAL (MISCELLANEOUS) IMPLANT
SPOT EX ENDOSCOPIC TATTOO (MISCELLANEOUS)
SYR INFLATION 60ML (SYRINGE) IMPLANT
TRAP ETRAP POLY (MISCELLANEOUS) IMPLANT
VARIJECT INJECTOR VIN23 (MISCELLANEOUS)
WATER STERILE IRR 250ML POUR (IV SOLUTION) ×2 IMPLANT
WIRE CRE 18-20MM 8CM F G (MISCELLANEOUS) IMPLANT

## 2015-12-14 NOTE — Anesthesia Preprocedure Evaluation (Signed)
Anesthesia Evaluation  Patient identified by MRN, date of birth, ID band Patient awake    Reviewed: Allergy & Precautions, H&P , NPO status , Patient's Chart, lab work & pertinent test results, reviewed documented beta blocker date and time   Airway Mallampati: II  TM Distance: >3 FB Neck ROM: full    Dental no notable dental hx.    Pulmonary neg pulmonary ROS,    Pulmonary exam normal breath sounds clear to auscultation       Cardiovascular Exercise Tolerance: Good hypertension,  Rhythm:regular Rate:Normal     Neuro/Psych negative neurological ROS  negative psych ROS   GI/Hepatic Neg liver ROS, GERD  ,  Endo/Other  negative endocrine ROS  Renal/GU negative Renal ROS  negative genitourinary   Musculoskeletal   Abdominal   Peds  Hematology negative hematology ROS (+)   Anesthesia Other Findings   Reproductive/Obstetrics negative OB ROS                            Anesthesia Physical Anesthesia Plan  ASA: II  Anesthesia Plan: MAC   Post-op Pain Management:    Induction:   Airway Management Planned:   Additional Equipment:   Intra-op Plan:   Post-operative Plan:   Informed Consent: I have reviewed the patients History and Physical, chart, labs and discussed the procedure including the risks, benefits and alternatives for the proposed anesthesia with the patient or authorized representative who has indicated his/her understanding and acceptance.   Dental Advisory Given  Plan Discussed with: CRNA  Anesthesia Plan Comments:         Anesthesia Quick Evaluation  

## 2015-12-14 NOTE — Anesthesia Procedure Notes (Signed)
Procedure Name: MAC Date/Time: 12/14/2015 10:20 AM Performed by: Janna Arch Pre-anesthesia Checklist: Patient identified, Emergency Drugs available, Suction available and Patient being monitored Patient Re-evaluated:Patient Re-evaluated prior to inductionOxygen Delivery Method: Nasal cannula

## 2015-12-14 NOTE — H&P (Signed)
Evelyn Lame, MD [facility] [address], [place], [place] [address] Phone: [phone] Fax : [phone]  Primary Care Physician:  Halina Maidens, MD Primary Gastroenterologist:  Dr. Allen Norris  Pre-Procedure History & Physical: HPI:  Evelyn Clements is a 80 y.o. female is here for an endoscopy.   Past Medical History:  Diagnosis Date  . GERD (gastroesophageal reflux disease)   . Hypercholesteremia   . Hypertension     Past Surgical History:  Procedure Laterality Date  . ABDOMINAL HYSTERECTOMY  1990   Total  . APPENDECTOMY    . BREAST EXCISIONAL BIOPSY Right 1980's   lumpectomy lobular ca pt stated no chemo no rad  . BUNIONECTOMY Right   . CATARACT EXTRACTION W/ INTRAOCULAR LENS  IMPLANT, BILATERAL    . ESOPHAGOGASTRODUODENOSCOPY  2013   Barrett's esophagus  . TONSILLECTOMY      Prior to Admission medications   Medication Sig Start Date End Date Taking? Authorizing Provider  Bioflavonoid Products (VITAMIN C) CHEW Chew 1 tablet by mouth daily.   Yes Historical Provider, MD  Calcium Carb-Cholecalciferol (CALCIUM + D3) 600-200 MG-UNIT TABS Take 1 tablet by mouth daily.   Yes Historical Provider, MD  hydrochlorothiazide (HYDRODIURIL) 12.5 MG tablet Take 1 tablet by mouth  daily 08/14/15  Yes Glean Hess, MD  losartan (COZAAR) 100 MG tablet Take 1 tablet by mouth  daily 08/14/15  Yes Glean Hess, MD  mometasone (ELOCON) 0.1 % ointment  10/21/15  Yes Historical Provider, MD  MULTIPLE VITAMIN PO Take 1 tablet by mouth daily.   Yes Historical Provider, MD  nystatin ointment (MYCOSTATIN) Apply 1 application topically 2 (two) times daily. 10/16/15  Yes Glean Hess, MD  omeprazole (PRILOSEC) 40 MG capsule Take 1 capsule by mouth  daily 08/14/15  Yes Glean Hess, MD  triamcinolone cream (KENALOG) 0.1 %  04/08/15  Yes Historical Provider, MD  Icosapent Ethyl (VASCEPA) 1 g CAPS Take 2 capsules by mouth 2 (two) times daily. Patient not taking: Reported on 12/14/2015 02/13/15    Glean Hess, MD    Allergies as of 11/24/2015 - Review Complete 11/24/2015  Allergen Reaction Noted  . Zolpidem tartrate  06/21/2014    Family History  Problem Relation Age of Onset  . Leukemia Father   . Uterine cancer Mother     Social History   Social History  . Marital status: Single    Spouse name: N/A  . Number of children: N/A  . Years of education: N/A   Occupational History  . Not on file.   Social History Main Topics  . Smoking status: Never Smoker  . Smokeless tobacco: Never Used  . Alcohol use No  . Drug use: No  . Sexual activity: No   Other Topics Concern  . Not on file   Social History Narrative  . No narrative on file    Review of Systems: See HPI, otherwise negative ROS  Physical Exam: BP 131/66   Pulse 64   Temp 97.1 F (36.2 C) (Temporal)   Resp 16   Ht 5\' 2"  (1.575 m)   Wt 153 lb (69.4 kg)   SpO2 98%   BMI 27.98 kg/m  General:   Alert,  pleasant and cooperative in NAD Head:  Normocephalic and atraumatic. Neck:  Supple; no masses or thyromegaly. Lungs:  Clear throughout to auscultation.    Heart:  Regular rate and rhythm. Abdomen:  Soft, nontender and nondistended. Normal bowel sounds, without guarding, and without rebound.  Neurologic:  Alert and  oriented x4;  grossly normal neurologically.  Impression/Plan: [name] is here for an endoscopy to be performed for barrett's esophagus  Risks, benefits, limitations, and alternatives regarding  endoscopy have been reviewed with the patient.  Questions have been answered.  All parties agreeable.   Evelyn Lame, MD  12/14/2015, 9:26 AM

## 2015-12-14 NOTE — Op Note (Signed)
[facility] Gastroenterology Patient Name: Evelyn Clements Procedure Date: 12/14/2015 10:13 AM MRN: [id number] Account #: [id number] Date of Birth: [birth year] Admit Type: Outpatient Age: 80 Room: [facility] OR ROOM 01 Gender: Female Note Status: Finalized Procedure:            Upper GI endoscopy Indications:          Screening for Barrett's esophagus Providers:            Lucilla Lame MD, MD Referring MD:         Halina Maidens, MD (Referring MD) Medicines:            Propofol per Anesthesia Complications:        No immediate complications. Procedure:            Pre-Anesthesia Assessment:                       - Prior to the procedure, a History and Physical was                        performed, and patient medications and allergies were                        reviewed. The patient's tolerance of previous                        anesthesia was also reviewed. The risks and benefits of                        the procedure and the sedation options and risks were                        discussed with the patient. All questions were                        answered, and informed consent was obtained. Prior                        Anticoagulants: The patient has taken no previous                        anticoagulant or antiplatelet agents. ASA Grade                        Assessment: II - A patient with mild systemic disease.                        After reviewing the risks and benefits, the patient was                        deemed in satisfactory condition to undergo the                        procedure.                       After obtaining informed consent, the endoscope was                        passed under direct vision. Throughout the procedure,  the patient's blood pressure, pulse, and oxygen                        saturations were monitored continuously. The Olympus                        GIF H180J colonscope [id number]) was introduced            through the mouth, and advanced to the second part of                        duodenum. The upper GI endoscopy was accomplished                        without difficulty. The patient tolerated the procedure                        well. Findings:      A medium-sized hiatal hernia was present.      There were esophageal mucosal changes secondary to established       long-segment Barrett's disease present in the lower third of the       esophagus. The maximum longitudinal extent of these mucosal changes was       5 cm in length. Mucosa was biopsied with a cold forceps for histology in       4 quadrants at intervals of 2 cm at 25 and 30 cm from the incisors.      The stomach was normal.      The examined duodenum was normal. Impression:           - Medium-sized hiatal hernia.                       - Esophageal mucosal changes secondary to established                        long-segment Barrett's disease. Biopsied.                       - Normal stomach.                       - Normal examined duodenum. Recommendation:       - Discharge patient to home.                       - Resume previous diet.                       - Continue present medications.                       - Await pathology results. Procedure Code(s):    --- Professional ---                       [phone], Esophagogastroduodenoscopy, flexible, transoral;                        with biopsy, single or multiple Diagnosis Code(s):    --- Professional ---                       [id number], Encounter for screening for upper  gastrointestinal disorder                       K44.9, Diaphragmatic hernia without obstruction or                        gangrene                       K22.70, Barrett's esophagus without dysplasia CPT copyright 2016 American Medical Association. All rights reserved. The codes documented in this report are preliminary and upon coder review may  be revised to meet current compliance  requirements. Lucilla Lame MD, MD 12/14/2015 10:29:56 AM This report has been signed electronically. Number of Addenda: 0 Note Initiated On: 12/14/2015 10:13 AM Total Procedure Duration: 0 hours 4 minutes 34 seconds       [facility]

## 2015-12-14 NOTE — Anesthesia Postprocedure Evaluation (Signed)
Anesthesia Post Note  Patient: Evelyn Clements  Procedure(s) Performed: Procedure(s) (LRB): ESOPHAGOGASTRODUODENOSCOPY (EGD) WITH PROPOFOL (N/A)  Patient location during evaluation: PACU Anesthesia Type: MAC Level of consciousness: awake and alert Pain management: pain level controlled Vital Signs Assessment: post-procedure vital signs reviewed and stable Respiratory status: spontaneous breathing, nonlabored ventilation, respiratory function stable and patient connected to nasal cannula oxygen Cardiovascular status: stable and blood pressure returned to baseline Anesthetic complications: no    Alisa Graff

## 2015-12-14 NOTE — Transfer of Care (Signed)
Immediate Anesthesia Transfer of Care Note  Patient: Evelyn Clements  Procedure(s) Performed: Procedure(s): ESOPHAGOGASTRODUODENOSCOPY (EGD) WITH PROPOFOL (N/A)  Patient Location: PACU  Anesthesia Type: MAC  Level of Consciousness: awake, alert  and patient cooperative  Airway and Oxygen Therapy: Patient Spontanous Breathing and Patient connected to supplemental oxygen  Post-op Assessment: Post-op Vital signs reviewed, Patient's Cardiovascular Status Stable, Respiratory Function Stable, Patent Airway and No signs of Nausea or vomiting  Post-op Vital Signs: Reviewed and stable  Complications: No apparent anesthesia complications

## 2015-12-15 ENCOUNTER — Ambulatory Visit: Admit: 2015-12-15 | Payer: Medicare Other | Admitting: Gastroenterology

## 2015-12-15 ENCOUNTER — Encounter: Payer: Self-pay | Admitting: Gastroenterology

## 2015-12-15 SURGERY — ESOPHAGOGASTRODUODENOSCOPY (EGD) WITH PROPOFOL
Anesthesia: General

## 2015-12-17 ENCOUNTER — Encounter: Payer: Self-pay | Admitting: Gastroenterology

## 2015-12-21 ENCOUNTER — Telehealth: Payer: Self-pay

## 2015-12-21 NOTE — Telephone Encounter (Signed)
-----   Message from Lucilla Lame, MD sent at 12/21/2015 10:57 AM EST ----- Let the patient know that her Barrett's did not show any signs of cancerous tissue.

## 2015-12-21 NOTE — Telephone Encounter (Signed)
Pt notified of EGD results.  

## 2015-12-21 NOTE — Telephone Encounter (Signed)
LVM for pt to return my call.

## 2016-01-05 ENCOUNTER — Other Ambulatory Visit: Payer: Self-pay | Admitting: Internal Medicine

## 2016-01-05 DIAGNOSIS — Z1231 Encounter for screening mammogram for malignant neoplasm of breast: Secondary | ICD-10-CM

## 2016-01-20 ENCOUNTER — Encounter: Payer: Self-pay | Admitting: Internal Medicine

## 2016-01-20 ENCOUNTER — Ambulatory Visit (INDEPENDENT_AMBULATORY_CARE_PROVIDER_SITE_OTHER): Payer: Medicare HMO | Admitting: Internal Medicine

## 2016-01-20 VITALS — BP 112/68 | HR 105 | Temp 97.8°F | Ht 62.0 in | Wt 154.0 lb

## 2016-01-20 DIAGNOSIS — J40 Bronchitis, not specified as acute or chronic: Secondary | ICD-10-CM | POA: Diagnosis not present

## 2016-01-20 MED ORDER — AZITHROMYCIN 250 MG PO TABS: ORAL_TABLET | ORAL | 0 refills | Status: DC
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
## 2016-01-20 NOTE — Patient Instructions (Signed)
Continue Delsym cough syrup as needed  Drink plenty of fluids, rest and advance diet as tolerated

## 2016-02-02 ENCOUNTER — Telehealth: Payer: Self-pay | Admitting: Internal Medicine

## 2016-02-02 ENCOUNTER — Other Ambulatory Visit: Payer: Self-pay | Admitting: Internal Medicine

## 2016-02-02 MED ORDER — HYDROCHLOROTHIAZIDE 12.5 MG PO TABS
12.5000 mg | ORAL_TABLET | Freq: Every day | ORAL | 1 refills | Status: DC
Start: 2016-02-02 — End: 2016-05-24

## 2016-02-02 MED ORDER — OMEPRAZOLE 40 MG PO CPDR: 40.0000 mg | DELAYED_RELEASE_CAPSULE | Freq: Every day | ORAL | 1 refills | Status: DC

## 2016-02-02 MED ORDER — LOSARTAN POTASSIUM 100 MG PO TABS
100.0000 mg | ORAL_TABLET | Freq: Every day | ORAL | 1 refills | Status: DC
Start: 2016-02-02 — End: 2016-05-31

## 2016-02-02 NOTE — Telephone Encounter (Signed)
Sent to [facility]

## 2016-02-02 NOTE — Telephone Encounter (Signed)
Pt called need refill on Rx. Losartan, Omeprazole, HCTZ send to [facility]

## 2016-02-11 ENCOUNTER — Ambulatory Visit: Admission: RE | Admit: 2016-02-11 | Payer: Medicare Other | Source: Ambulatory Visit

## 2016-02-17 ENCOUNTER — Ambulatory Visit
Admission: RE | Admit: 2016-02-17 | Discharge: 2016-02-17 | Disposition: A | Discharge status: Home / Self Care | Payer: Medicare HMO | Source: Ambulatory Visit | Attending: Internal Medicine | Admitting: Internal Medicine

## 2016-02-17 DIAGNOSIS — Z1231 Encounter for screening mammogram for malignant neoplasm of breast: Secondary | ICD-10-CM | POA: Insufficient documentation

## 2016-02-18 ENCOUNTER — Ambulatory Visit: Admission: RE | Admit: 2016-02-18 | Payer: Medicare Other | Source: Ambulatory Visit

## 2016-05-24 ENCOUNTER — Other Ambulatory Visit: Payer: Self-pay | Admitting: Internal Medicine

## 2016-05-31 ENCOUNTER — Encounter: Payer: Self-pay | Admitting: Internal Medicine

## 2016-05-31 ENCOUNTER — Ambulatory Visit (INDEPENDENT_AMBULATORY_CARE_PROVIDER_SITE_OTHER): Payer: Medicare HMO | Admitting: Internal Medicine

## 2016-05-31 VITALS — BP 120/72 | HR 71 | Ht 62.0 in | Wt 154.0 lb

## 2016-05-31 DIAGNOSIS — I10 Essential (primary) hypertension: Secondary | ICD-10-CM | POA: Diagnosis not present

## 2016-05-31 DIAGNOSIS — E782 Mixed hyperlipidemia: Secondary | ICD-10-CM

## 2016-05-31 MED ORDER — LOSARTAN POTASSIUM 100 MG PO TABS: 100.0000 mg | ORAL_TABLET | Freq: Every day | ORAL | 1 refills | Status: DC

## 2016-05-31 NOTE — Patient Instructions (Signed)
If cholesterol is still very high, will start a very low dose of Crestor.
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
## 2016-06-01 LAB — LIPID PANEL
Chol/HDL Ratio: 3.9 ratio (ref 0.0–4.4)
Cholesterol, Total: 254 mg/dL — ABNORMAL HIGH (ref 100–199)
HDL: 65 mg/dL (ref >39)
LDL Calculated: 146 mg/dL — ABNORMAL HIGH (ref 0–99)
Triglycerides: 214 mg/dL — ABNORMAL HIGH (ref 0–149)
VLDL Cholesterol Cal: 43 mg/dL — ABNORMAL HIGH (ref 5–40)

## 2016-06-01 LAB — BASIC METABOLIC PANEL
BUN/Creatinine Ratio: 14 (ref 12–28)
BUN: 13 mg/dL (ref 8–27)
CO2: 23 mmol/L (ref 18–29)
Calcium: 9.3 mg/dL (ref 8.7–10.3)
Chloride: 97 mmol/L (ref 96–106)
Creatinine, Ser: 0.94 mg/dL (ref 0.57–1.00)
GFR calc Af Amer: 67 mL/min/{1.73_m2} (ref >59)
GFR calc non Af Amer: 58 mL/min/{1.73_m2} — ABNORMAL LOW (ref >59)
Glucose: 104 mg/dL — ABNORMAL HIGH (ref 65–99)
Potassium: 4 mmol/L (ref 3.5–5.2)
Sodium: 136 mmol/L (ref 134–144)

## 2016-06-14 ENCOUNTER — Telehealth: Payer: Self-pay

## 2016-06-14 NOTE — Telephone Encounter (Signed)
Pt still having issues with pain radiating from hip to hip. Would like possible referral for further eval. Please advice.

## 2016-06-15 NOTE — Telephone Encounter (Signed)
Pt informed on VM to schedule OV for this issue to be seen.

## 2016-06-15 NOTE — Telephone Encounter (Signed)
Don't see where she's been seen for an issue like this? Patient would likely have to schedule appt with Dr. Army Melia so she can document issue, evaluate, and make appropriate referral. Thank you.

## 2016-06-21 ENCOUNTER — Ambulatory Visit: Payer: Medicare HMO | Admitting: Internal Medicine

## 2016-06-22 ENCOUNTER — Ambulatory Visit (INDEPENDENT_AMBULATORY_CARE_PROVIDER_SITE_OTHER): Payer: Medicare HMO | Admitting: Internal Medicine

## 2016-06-22 ENCOUNTER — Encounter: Payer: Self-pay | Admitting: Internal Medicine

## 2016-06-22 VITALS — BP 118/68 | HR 75 | Ht 62.0 in | Wt 157.0 lb

## 2016-06-22 DIAGNOSIS — G8929 Other chronic pain: Secondary | ICD-10-CM | POA: Diagnosis not present

## 2016-06-22 DIAGNOSIS — M545 Low back pain, unspecified: Secondary | ICD-10-CM | POA: Insufficient documentation

## 2016-06-22 DIAGNOSIS — H6121 Impacted cerumen, right ear: Secondary | ICD-10-CM | POA: Diagnosis not present

## 2016-06-22 NOTE — Patient Instructions (Signed)
See ENT for evaluation and cerumen removal  Dr. Kathyrn Sheriff    Friday 06/24/16 at 2 PM
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
## 2016-06-24 DIAGNOSIS — H902 Conductive hearing loss, unspecified: Secondary | ICD-10-CM | POA: Diagnosis not present

## 2016-06-24 DIAGNOSIS — H6120 Impacted cerumen, unspecified ear: Secondary | ICD-10-CM | POA: Diagnosis not present

## 2016-08-31 ENCOUNTER — Ambulatory Visit (INDEPENDENT_AMBULATORY_CARE_PROVIDER_SITE_OTHER): Payer: Medicare HMO | Admitting: Internal Medicine

## 2016-08-31 VITALS — BP 118/60 | HR 75 | Ht 62.0 in | Wt 163.0 lb

## 2016-08-31 DIAGNOSIS — I10 Essential (primary) hypertension: Secondary | ICD-10-CM

## 2016-08-31 DIAGNOSIS — E782 Mixed hyperlipidemia: Secondary | ICD-10-CM

## 2016-08-31 DIAGNOSIS — K22719 Barrett's esophagus with dysplasia, unspecified: Secondary | ICD-10-CM

## 2016-08-31 MED ORDER — OMEPRAZOLE 40 MG PO CPDR: 40.0000 mg | DELAYED_RELEASE_CAPSULE | Freq: Every day | ORAL | 1 refills | Status: DC

## 2016-08-31 MED ORDER — HYDROCHLOROTHIAZIDE 12.5 MG PO TABS: 12.5000 mg | ORAL_TABLET | Freq: Every day | ORAL | 1 refills | Status: DC

## 2016-08-31 MED ORDER — LOSARTAN POTASSIUM 100 MG PO TABS: 100.0000 mg | ORAL_TABLET | Freq: Every day | ORAL | 1 refills | Status: DC
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
## 2016-08-31 NOTE — Patient Instructions (Signed)
[facility]  [address] [place], [place] [address]  [phone]   Call about Reclast treatment for osteoporosis

## 2016-09-01 LAB — COMPREHENSIVE METABOLIC PANEL WITH GFR
ALT: 19 IU/L (ref 0–32)
AST: 23 IU/L (ref 0–40)
Albumin/Globulin Ratio: 1.6 (ref 1.2–2.2)
Albumin: 4.2 g/dL (ref 3.5–4.8)
Alkaline Phosphatase: 79 IU/L (ref 39–117)
BUN/Creatinine Ratio: 15 (ref 12–28)
BUN: 18 mg/dL (ref 8–27)
Bilirubin Total: 0.2 mg/dL (ref 0.0–1.2)
CO2: 25 mmol/L (ref 20–29)
Calcium: 9.6 mg/dL (ref 8.7–10.3)
Chloride: 97 mmol/L (ref 96–106)
Creatinine, Ser: 1.24 mg/dL — ABNORMAL HIGH (ref 0.57–1.00)
GFR calc Af Amer: 48 mL/min/1.73 — ABNORMAL LOW
GFR calc non Af Amer: 41 mL/min/1.73 — ABNORMAL LOW
Globulin, Total: 2.6 g/dL (ref 1.5–4.5)
Glucose: 94 mg/dL (ref 65–99)
Potassium: 4.1 mmol/L (ref 3.5–5.2)
Sodium: 137 mmol/L (ref 134–144)
Total Protein: 6.8 g/dL (ref 6.0–8.5)

## 2016-09-01 LAB — CBC WITH DIFFERENTIAL/PLATELET
Basophils Absolute: 0.1 10*3/uL (ref 0.0–0.2)
Basos: 1 %
EOS (ABSOLUTE): 0.2 10*3/uL (ref 0.0–0.4)
Eos: 2 %
Hematocrit: 37.9 % (ref 34.0–46.6)
Hemoglobin: 12.1 g/dL (ref 11.1–15.9)
Immature Grans (Abs): 0 10*3/uL (ref 0.0–0.1)
Immature Granulocytes: 0 %
Lymphocytes Absolute: 2.9 10*3/uL (ref 0.7–3.1)
Lymphs: 34 %
MCH: 26.4 pg — ABNORMAL LOW (ref 26.6–33.0)
MCHC: 31.9 g/dL (ref 31.5–35.7)
MCV: 83 fL (ref 79–97)
Monocytes Absolute: 0.7 10*3/uL (ref 0.1–0.9)
Monocytes: 8 %
Neutrophils Absolute: 4.8 10*3/uL (ref 1.4–7.0)
Neutrophils: 55 %
Platelets: 249 10*3/uL (ref 150–379)
RBC: 4.58 x10E6/uL (ref 3.77–5.28)
RDW: 17.1 % — ABNORMAL HIGH (ref 12.3–15.4)
WBC: 8.6 10*3/uL (ref 3.4–10.8)

## 2016-09-01 LAB — TSH: TSH: 3.65 u[IU]/mL (ref 0.450–4.500)

## 2016-09-08 DIAGNOSIS — N183 Chronic kidney disease, stage 3 (moderate): Secondary | ICD-10-CM | POA: Diagnosis not present

## 2016-09-08 DIAGNOSIS — M810 Age-related osteoporosis without current pathological fracture: Secondary | ICD-10-CM | POA: Diagnosis not present

## 2016-09-13 ENCOUNTER — Other Ambulatory Visit: Payer: Self-pay | Admitting: Internal Medicine

## 2016-09-13 DIAGNOSIS — M810 Age-related osteoporosis without current pathological fracture: Secondary | ICD-10-CM

## 2016-09-20 ENCOUNTER — Telehealth: Payer: Self-pay

## 2016-09-20 NOTE — Telephone Encounter (Signed)
No prescriptions can be sent in without an OV.

## 2016-09-20 NOTE — Telephone Encounter (Signed)
Patient informed. 

## 2016-09-20 NOTE — Telephone Encounter (Signed)
Patient calling stating sciatica is getting worse. Wants to know if something can be sent in to [facility] in [place]. I see we seen her for this in June and she did not want medication at the time. Does she need OV to discuss?

## 2016-09-21 ENCOUNTER — Ambulatory Visit (INDEPENDENT_AMBULATORY_CARE_PROVIDER_SITE_OTHER): Payer: Medicare HMO | Admitting: Internal Medicine

## 2016-09-21 ENCOUNTER — Encounter: Payer: Self-pay | Admitting: Internal Medicine

## 2016-09-21 VITALS — BP 120/64 | HR 70 | Ht 62.0 in | Wt 158.0 lb

## 2016-09-21 DIAGNOSIS — G8929 Other chronic pain: Secondary | ICD-10-CM

## 2016-09-21 DIAGNOSIS — Z23 Encounter for immunization: Secondary | ICD-10-CM | POA: Diagnosis not present

## 2016-09-21 DIAGNOSIS — M5441 Lumbago with sciatica, right side: Secondary | ICD-10-CM

## 2016-09-21 MED ORDER — PREDNISONE 10 MG PO TABS: ORAL_TABLET | ORAL | 0 refills | Status: DC
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
## 2016-10-03 ENCOUNTER — Telehealth: Payer: Self-pay

## 2016-10-03 NOTE — Telephone Encounter (Signed)
No.  She just had prednisone and she can not keep taking it.  I need to refer her to Orthopedics.  Where would she like to go?

## 2016-10-03 NOTE — Telephone Encounter (Signed)
Patient called stating sciatica is happening again. Cannot stand the pain. Prednisone took the pain away. Wants to know if we can send in refill on medication. Please Advise.

## 2016-10-04 ENCOUNTER — Other Ambulatory Visit: Payer: Self-pay | Admitting: Internal Medicine

## 2016-10-04 DIAGNOSIS — G8929 Other chronic pain: Secondary | ICD-10-CM

## 2016-10-04 DIAGNOSIS — M545 Low back pain: Principal | ICD-10-CM

## 2016-10-04 NOTE — Telephone Encounter (Signed)
I have put in a referral to [facility] in [place].

## 2016-10-04 NOTE — Telephone Encounter (Signed)
Spoke with patient-  She stated she has never been to ortho before, but would like to go. She wants to go wherever you recommend. Please Advise.

## 2016-10-10 DIAGNOSIS — M4316 Spondylolisthesis, lumbar region: Secondary | ICD-10-CM | POA: Diagnosis not present

## 2016-10-10 DIAGNOSIS — M5137 Other intervertebral disc degeneration, lumbosacral region: Secondary | ICD-10-CM | POA: Diagnosis not present

## 2016-10-10 DIAGNOSIS — G8929 Other chronic pain: Secondary | ICD-10-CM | POA: Diagnosis not present

## 2016-10-10 DIAGNOSIS — M5440 Lumbago with sciatica, unspecified side: Secondary | ICD-10-CM | POA: Diagnosis not present

## 2016-10-10 DIAGNOSIS — M545 Low back pain: Secondary | ICD-10-CM | POA: Diagnosis not present

## 2016-10-18 ENCOUNTER — Encounter: Payer: Self-pay | Admitting: Internal Medicine

## 2016-10-18 ENCOUNTER — Ambulatory Visit (INDEPENDENT_AMBULATORY_CARE_PROVIDER_SITE_OTHER): Payer: Medicare HMO | Admitting: Internal Medicine

## 2016-10-18 ENCOUNTER — Ambulatory Visit
Admission: RE | Admit: 2016-10-18 | Discharge: 2016-10-18 | Disposition: A | Discharge status: Home / Self Care | Payer: Medicare HMO | Source: Ambulatory Visit | Attending: Internal Medicine | Admitting: Internal Medicine

## 2016-10-18 VITALS — BP 124/67 | HR 82 | Ht 62.0 in | Wt 157.0 lb

## 2016-10-18 DIAGNOSIS — M25571 Pain in right ankle and joints of right foot: Secondary | ICD-10-CM | POA: Insufficient documentation

## 2016-10-18 DIAGNOSIS — G8929 Other chronic pain: Secondary | ICD-10-CM

## 2016-10-18 DIAGNOSIS — M5440 Lumbago with sciatica, unspecified side: Secondary | ICD-10-CM | POA: Diagnosis not present

## 2016-10-18 DIAGNOSIS — M4316 Spondylolisthesis, lumbar region: Secondary | ICD-10-CM | POA: Diagnosis not present

## 2016-10-18 DIAGNOSIS — I10 Essential (primary) hypertension: Secondary | ICD-10-CM

## 2016-10-18 DIAGNOSIS — M5137 Other intervertebral disc degeneration, lumbosacral region: Secondary | ICD-10-CM | POA: Diagnosis not present
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
## 2016-10-18 NOTE — Patient Instructions (Signed)
Continue tylenol as needed for ankle pain.

## 2016-10-20 ENCOUNTER — Ambulatory Visit
Admission: RE | Admit: 2016-10-20 | Discharge: 2016-10-20 | Disposition: A | Discharge status: Home / Self Care | Payer: Medicare HMO | Source: Ambulatory Visit | Attending: Internal Medicine | Admitting: Internal Medicine

## 2016-10-20 DIAGNOSIS — M810 Age-related osteoporosis without current pathological fracture: Secondary | ICD-10-CM | POA: Diagnosis not present

## 2016-10-20 DIAGNOSIS — Z780 Asymptomatic menopausal state: Secondary | ICD-10-CM | POA: Diagnosis not present

## 2016-10-21 DIAGNOSIS — M4316 Spondylolisthesis, lumbar region: Secondary | ICD-10-CM | POA: Diagnosis not present

## 2016-10-21 DIAGNOSIS — M5137 Other intervertebral disc degeneration, lumbosacral region: Secondary | ICD-10-CM | POA: Diagnosis not present

## 2016-10-21 DIAGNOSIS — M5440 Lumbago with sciatica, unspecified side: Secondary | ICD-10-CM | POA: Diagnosis not present

## 2016-10-25 DIAGNOSIS — M4316 Spondylolisthesis, lumbar region: Secondary | ICD-10-CM | POA: Diagnosis not present

## 2016-10-25 DIAGNOSIS — M5137 Other intervertebral disc degeneration, lumbosacral region: Secondary | ICD-10-CM | POA: Diagnosis not present

## 2016-10-25 DIAGNOSIS — M5440 Lumbago with sciatica, unspecified side: Secondary | ICD-10-CM | POA: Diagnosis not present

## 2016-10-28 ENCOUNTER — Telehealth: Payer: Self-pay

## 2016-10-28 NOTE — Telephone Encounter (Signed)
Patient called stating fell on knee in parking lot - left VM for patient to callback to let us know if swollen or having difficulty walking or bending it. If not, told to try to keep elevated through the weekend, and ice it. If no better by Monday then we will get her in to be seen for appt. Awaiting pt call back to discuss more.

## 2016-11-01 ENCOUNTER — Ambulatory Visit (INDEPENDENT_AMBULATORY_CARE_PROVIDER_SITE_OTHER): Payer: Medicare HMO | Admitting: Internal Medicine

## 2016-11-01 ENCOUNTER — Encounter: Payer: Self-pay | Admitting: Internal Medicine

## 2016-11-01 VITALS — BP 124/80 | HR 70 | Ht 62.0 in | Wt 159.0 lb

## 2016-11-01 DIAGNOSIS — S8001XA Contusion of right knee, initial encounter: Secondary | ICD-10-CM | POA: Diagnosis not present
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
## 2016-11-03 ENCOUNTER — Telehealth: Payer: Self-pay | Admitting: Internal Medicine

## 2016-11-03 NOTE — Telephone Encounter (Signed)
Called pt to re sched for AWV with Nurse Health Advisor.

## 2016-11-04 DIAGNOSIS — M810 Age-related osteoporosis without current pathological fracture: Secondary | ICD-10-CM | POA: Diagnosis not present

## 2016-11-04 DIAGNOSIS — Z0000 Encounter for general adult medical examination without abnormal findings: Secondary | ICD-10-CM | POA: Diagnosis not present

## 2016-11-04 DIAGNOSIS — I10 Essential (primary) hypertension: Secondary | ICD-10-CM | POA: Diagnosis not present

## 2016-11-04 DIAGNOSIS — K219 Gastro-esophageal reflux disease without esophagitis: Secondary | ICD-10-CM | POA: Diagnosis not present

## 2016-11-04 DIAGNOSIS — Z6829 Body mass index (BMI) 29.0-29.9, adult: Secondary | ICD-10-CM | POA: Diagnosis not present

## 2016-11-04 DIAGNOSIS — G4700 Insomnia, unspecified: Secondary | ICD-10-CM | POA: Diagnosis not present

## 2016-11-04 DIAGNOSIS — M47816 Spondylosis without myelopathy or radiculopathy, lumbar region: Secondary | ICD-10-CM | POA: Diagnosis not present

## 2016-11-07 ENCOUNTER — Telehealth: Payer: Self-pay | Admitting: Internal Medicine

## 2016-11-07 NOTE — Telephone Encounter (Signed)
Called pt to re sched for AWV

## 2016-11-08 DIAGNOSIS — M5440 Lumbago with sciatica, unspecified side: Secondary | ICD-10-CM | POA: Diagnosis not present

## 2016-11-08 DIAGNOSIS — M4316 Spondylolisthesis, lumbar region: Secondary | ICD-10-CM | POA: Diagnosis not present

## 2016-11-08 DIAGNOSIS — M5137 Other intervertebral disc degeneration, lumbosacral region: Secondary | ICD-10-CM | POA: Diagnosis not present

## 2016-11-14 ENCOUNTER — Other Ambulatory Visit: Payer: Self-pay | Admitting: Internal Medicine

## 2016-11-14 ENCOUNTER — Ambulatory Visit: Payer: Medicare HMO

## 2016-11-15 DIAGNOSIS — M810 Age-related osteoporosis without current pathological fracture: Secondary | ICD-10-CM | POA: Diagnosis not present

## 2016-11-16 DIAGNOSIS — M5137 Other intervertebral disc degeneration, lumbosacral region: Secondary | ICD-10-CM | POA: Diagnosis not present

## 2016-11-16 DIAGNOSIS — M4316 Spondylolisthesis, lumbar region: Secondary | ICD-10-CM | POA: Diagnosis not present

## 2016-11-16 DIAGNOSIS — M5440 Lumbago with sciatica, unspecified side: Secondary | ICD-10-CM | POA: Diagnosis not present

## 2016-11-17 ENCOUNTER — Ambulatory Visit (INDEPENDENT_AMBULATORY_CARE_PROVIDER_SITE_OTHER): Payer: Medicare HMO

## 2016-11-17 VITALS — BP 138/70 | HR 88 | Temp 97.8°F | Resp 16 | Ht 62.0 in | Wt 158.0 lb

## 2016-11-17 DIAGNOSIS — Z0000 Encounter for general adult medical examination without abnormal findings: Secondary | ICD-10-CM

## 2016-11-17 NOTE — Progress Notes (Signed)
Subjective:   Evelyn Clements is a 81 y.o. female who presents for Medicare Annual (Subsequent) preventive examination.  Review of Systems:  N/A Cardiac Risk Factors include: advanced age (>44men, >56 women);hypertension;dyslipidemia     Objective:     Vitals: BP 138/70 (BP Location: Right Arm, Patient Position: Sitting, Cuff Size: Normal)   Pulse 88   Temp 97.8 F (36.6 C) (Oral)   Resp 16   Ht 5\' 2"  (1.575 m)   Wt 158 lb (71.7 kg)   BMI 28.90 kg/m   Body mass index is 28.9 kg/m.   Tobacco History  Smoking Status  . Never Smoker  Smokeless Tobacco  . Never Used     Counseling given: Not Answered   Past Medical History:  Diagnosis Date  . GERD (gastroesophageal reflux disease)   . Hypercholesteremia   . Hypertension    Past Surgical History:  Procedure Laterality Date  . ABDOMINAL HYSTERECTOMY  1990   Total  . APPENDECTOMY    . BREAST EXCISIONAL BIOPSY Right 1980's   lumpectomy lobular ca pt stated no chemo no rad  . BUNIONECTOMY Right   . CATARACT EXTRACTION W/ INTRAOCULAR LENS  IMPLANT, BILATERAL    . ESOPHAGOGASTRODUODENOSCOPY  2013   Barrett's esophagus  . ESOPHAGOGASTRODUODENOSCOPY (EGD) WITH PROPOFOL N/A 12/14/2015   Procedure: ESOPHAGOGASTRODUODENOSCOPY (EGD) WITH PROPOFOL;  Surgeon: Lucilla Lame, MD;  Location: [facility];  Service: Endoscopy;  Laterality: N/A;  . TONSILLECTOMY     Family History  Problem Relation Age of Onset  . Leukemia Father   . Uterine cancer Mother   . Breast cancer Neg Hx    History  Sexual Activity  . Sexual activity: No    Outpatient Encounter Prescriptions as of 11/17/2016  Medication Sig  . Bioflavonoid Products (VITAMIN C) CHEW Chew 1 tablet by mouth daily.  . Calcium Carb-Cholecalciferol (CALCIUM + D3) 600-200 MG-UNIT TABS Take 1 tablet by mouth daily.  . hydrochlorothiazide (HYDRODIURIL) 12.5 MG tablet Take 1 tablet (12.5 mg total) by mouth daily.  Marland Kitchen losartan (COZAAR) 100 MG tablet Take 1 tablet (100 mg  total) by mouth daily.  . meloxicam (MOBIC) 7.5 MG tablet Take 7.5 mg by mouth daily.  Marland Kitchen omeprazole (PRILOSEC) 40 MG capsule Take 1 capsule (40 mg total) by mouth daily.  . mometasone (ELOCON) 0.1 % ointment   . [DISCONTINUED] hydrochlorothiazide (HYDRODIURIL) 12.5 MG tablet TAKE 1 TABLET DAILY  . [DISCONTINUED] losartan (COZAAR) 100 MG tablet TAKE 1 TABLET DAILY  . [DISCONTINUED] omeprazole (PRILOSEC) 40 MG capsule TAKE 1 CAPSULE DAILY   No facility-administered encounter medications on file as of 11/17/2016.     Activities of Daily Living In your present state of health, do you have any difficulty performing the following activities: 11/17/2016 12/14/2015  Hearing? N N  Vision? N N  Difficulty concentrating or making decisions? N N  Walking or climbing stairs? N N  Dressing or bathing? N N  Doing errands, shopping? N -  Preparing Food and eating ? N -  Using the Toilet? N -  In the past six months, have you accidently leaked urine? N -  Do you have problems with loss of bowel control? N -  Managing your Medications? N -  Managing your Finances? N -  Housekeeping or managing your Housekeeping? N -  Some recent data might be hidden    Patient Care Team: Glean Hess, MD as PCP - General (Family Medicine)    Assessment:     Exercise Activities  and Dietary recommendations Current Exercise Habits: Home exercise routine, Type of exercise: Other - see comments (stationary bike, elliptical), Time (Minutes): 20, Frequency (Times/Week): 7, Weekly Exercise (Minutes/Week): 140, Intensity: Mild, Exercise limited by: None identified  Goals    . Have 3 meals a day          Continue to monitor portion sizes and eat at least 3-5 servings of fruits and vegetables per day    . Weight (lb) < 200 lb (90.7 kg)          Wants to lose stomach       Fall Risk Fall Risk  11/17/2016 10/18/2016 10/09/2015 10/02/2015 08/29/2014  Falls in the past year? Yes No No No No  Number falls in past yr:  1 - - - -  Injury with Fall? No - - - -  Risk for fall due to : Impaired vision - - - -  Risk for fall due to: Comment Requires updated eye exam. States occasional visual defects - - - -  Follow up Education provided;Falls prevention discussed - - - -   Depression Screen PHQ 2/9 Scores 11/17/2016 10/18/2016 10/09/2015 10/02/2015  PHQ - 2 Score 0 0 0 0     Cognitive Function     6CIT Screen 11/17/2016 10/09/2015  What Year? 0 points 0 points  What month? 0 points 0 points  What time? 0 points 0 points  Count back from 20 0 points 0 points  Months in reverse 0 points 0 points  Repeat phrase 0 points 0 points  Total Score 0 0    Immunization History  Administered Date(s) Administered  . Influenza, High Dose Seasonal PF 09/21/2016  . Influenza,inj,Quad PF,6+ Mos 10/20/2014, 10/09/2015  . Influenza-Unspecified 10/17/2013  . Pneumococcal Conjugate-13 06/06/2014  . Pneumococcal Polysaccharide-23 01/19/2012  . Tdap 01/18/2010  . Zoster 01/19/2012   Screening Tests Health Maintenance  Topic Date Due  . MAMMOGRAM  02/16/2017  . TETANUS/TDAP  01/19/2020  . INFLUENZA VACCINE  Completed  . DEXA SCAN  Completed  . PNA vac Low Risk Adult  Completed      Plan:    I have personally reviewed and addressed the Medicare Annual Wellness questionnaire and have noted the following in the patient's chart:  A. Medical and social history B. Use of alcohol, tobacco or illicit drugs  C. Current medications and supplements D. Functional ability and status E.  Nutritional status F.  Physical activity G. Advance directives H. List of other physicians I.  Hospitalizations, surgeries, and ER visits in previous 12 months J.  [place] such as hearing and vision if needed, cognitive and depression L. Referrals and appointments - none  In addition, I have reviewed and discussed with patient certain preventive protocols, quality metrics, and best practice recommendations. A written  personalized care plan for preventive services as well as general preventive health recommendations were provided to patient.  See attached scanned questionnaire for additional information.   Signed,  Aleatha Borer, LPN Nurse Health Advisor  MD Recommendations: None

## 2016-11-17 NOTE — Patient Instructions (Signed)
Evelyn Clements , Thank you for taking time to come for your Medicare Wellness Visit. I appreciate your ongoing commitment to your health goals. Please review the following plan we discussed and let me know if I can assist you in the future.   Screening recommendations/referrals: Colonoscopy: Completed 01/18/10. No longer required Mammogram: Completed 02/17/16. Repeat mammograms annually Bone Density: Completed 10/20/16 Recommended yearly ophthalmology/optometry visit for glaucoma screening and checkup Recommended yearly dental visit for hygiene and checkup  Vaccinations: Influenza vaccine: Up to date Pneumococcal vaccine: Completed series Tdap vaccine: Up to date Shingles vaccine: Declined. Please call your insurance company to determine your out of pocket expense.  Advanced directives: Please bring a copy of your POA (Power of Attorney) and/or Living Will to your next appointment.    Conditions/risks identified: Fall risk prevention discussed; Continue to monitor portion sizes and eat at least 3-5 servings of fruits and vegetables per day  Next appointment: You are scheduled to see Dr. Army Melia on 03/03/17 @ 2:45pm.  Please schedule your annual wellness visit with the Nurse Health Advisor in one year.   Preventive Care 13 Years and Older, Female Preventive care refers to lifestyle choices and visits with your health care provider that can promote health and wellness. What does preventive care include?  A yearly physical exam. This is also called an annual well check.  Dental exams once or twice a year.  Routine eye exams. Ask your health care provider how often you should have your eyes checked.  Personal lifestyle choices, including:  Daily care of your teeth and gums.  Regular physical activity.  Eating a healthy diet.  Avoiding tobacco and drug use.  Limiting alcohol use.  Practicing safe sex.  Taking low-dose aspirin every day.  Taking vitamin and mineral supplements as  recommended by your health care provider. What happens during an annual well check? The services and screenings done by your health care provider during your annual well check will depend on your age, overall health, lifestyle risk factors, and family history of disease. Counseling  Your health care provider may ask you questions about your:  Alcohol use.  Tobacco use.  Drug use.  Emotional well-being.  Home and relationship well-being.  Sexual activity.  Eating habits.  History of falls.  Memory and ability to understand (cognition).  Work and work Statistician.  Reproductive health. Screening  You may have the following tests or measurements:  Height, weight, and BMI.  Blood pressure.  Lipid and cholesterol levels. These may be checked every 5 years, or more frequently if you are over 40 years old.  Skin check.  Lung cancer screening. You may have this screening every year starting at age 44 if you have a 30-pack-year history of smoking and currently smoke or have quit within the past 15 years.  Fecal occult blood test (FOBT) of the stool. You may have this test every year starting at age 55.  Flexible sigmoidoscopy or colonoscopy. You may have a sigmoidoscopy every 5 years or a colonoscopy every 10 years starting at age 37.  Hepatitis C blood test.  Hepatitis B blood test.  Sexually transmitted disease (STD) testing.  Diabetes screening. This is done by checking your blood sugar (glucose) after you have not eaten for a while (fasting). You may have this done every 1-3 years.  Bone density scan. This is done to screen for osteoporosis. You may have this done starting at age 49.  Mammogram. This may be done every 1-2 years. Talk to  your health care provider about how often you should have regular mammograms. Talk with your health care provider about your test results, treatment options, and if necessary, the need for more tests. Vaccines  Your health care  provider may recommend certain vaccines, such as:  Influenza vaccine. This is recommended every year.  Tetanus, diphtheria, and acellular pertussis (Tdap, Td) vaccine. You may need a Td booster every 10 years.  Zoster vaccine. You may need this after age 32.  Pneumococcal 13-valent conjugate (PCV13) vaccine. One dose is recommended after age 71.  Pneumococcal polysaccharide (PPSV23) vaccine. One dose is recommended after age 67. Talk to your health care provider about which screenings and vaccines you need and how often you need them. This information is not intended to replace advice given to you by your health care provider. Make sure you discuss any questions you have with your health care provider. Document Released: 01/30/2015 Document Revised: 09/23/2015 Document Reviewed: 11/04/2014 Elsevier Interactive Patient Education  2017 [employer] Prevention in the Home Falls can cause injuries. They can happen to people of all ages. There are many things you can do to make your home safe and to help prevent falls. What can I do on the outside of my home?  Regularly fix the edges of walkways and driveways and fix any cracks.  Remove anything that might make you trip as you walk through a door, such as a raised step or threshold.  Trim any bushes or trees on the path to your home.  Use bright outdoor lighting.  Clear any walking paths of anything that might make someone trip, such as rocks or tools.  Regularly check to see if handrails are loose or broken. Make sure that both sides of any steps have handrails.  Any raised decks and porches should have guardrails on the edges.  Have any leaves, snow, or ice cleared regularly.  Use sand or salt on walking paths during winter.  Clean up any spills in your garage right away. This includes oil or grease spills. What can I do in the bathroom?  Use night lights.  Install grab bars by the toilet and in the tub and shower. Do  not use towel bars as grab bars.  Use non-skid mats or decals in the tub or shower.  If you need to sit down in the shower, use a plastic, non-slip stool.  Keep the floor dry. Clean up any water that spills on the floor as soon as it happens.  Remove soap buildup in the tub or shower regularly.  Attach bath mats securely with double-sided non-slip rug tape.  Do not have throw rugs and other things on the floor that can make you trip. What can I do in the bedroom?  Use night lights.  Make sure that you have a light by your bed that is easy to reach.  Do not use any sheets or blankets that are too big for your bed. They should not hang down onto the floor.  Have a firm chair that has side arms. You can use this for support while you get dressed.  Do not have throw rugs and other things on the floor that can make you trip. What can I do in the kitchen?  Clean up any spills right away.  Avoid walking on wet floors.  Keep items that you use a lot in easy-to-reach places.  If you need to reach something above you, use a strong step stool that has  a grab bar.  Keep electrical cords out of the way.  Do not use floor polish or wax that makes floors slippery. If you must use wax, use non-skid floor wax.  Do not have throw rugs and other things on the floor that can make you trip. What can I do with my stairs?  Do not leave any items on the stairs.  Make sure that there are handrails on both sides of the stairs and use them. Fix handrails that are broken or loose. Make sure that handrails are as long as the stairways.  Check any carpeting to make sure that it is firmly attached to the stairs. Fix any carpet that is loose or worn.  Avoid having throw rugs at the top or bottom of the stairs. If you do have throw rugs, attach them to the floor with carpet tape.  Make sure that you have a light switch at the top of the stairs and the bottom of the stairs. If you do not have them,  ask someone to add them for you. What else can I do to help prevent falls?  Wear shoes that:  Do not have high heels.  Have rubber bottoms.  Are comfortable and fit you well.  Are closed at the toe. Do not wear sandals.  If you use a stepladder:  Make sure that it is fully opened. Do not climb a closed stepladder.  Make sure that both sides of the stepladder are locked into place.  Ask someone to hold it for you, if possible.  Clearly mark and make sure that you can see:  Any grab bars or handrails.  First and last steps.  Where the edge of each step is.  Use tools that help you move around (mobility aids) if they are needed. These include:  Canes.  Walkers.  Scooters.  Crutches.  Turn on the lights when you go into a dark area. Replace any light bulbs as soon as they burn out.  Set up your furniture so you have a clear path. Avoid moving your furniture around.  If any of your floors are uneven, fix them.  If there are any pets around you, be aware of where they are.  Review your medicines with your doctor. Some medicines can make you feel dizzy. This can increase your chance of falling. Ask your doctor what other things that you can do to help prevent falls. This information is not intended to replace advice given to you by your health care provider. Make sure you discuss any questions you have with your health care provider. Document Released: 10/30/2008 Document Revised: 06/11/2015 Document Reviewed: 02/07/2014 Elsevier Interactive Patient Education  2017 [employer].

## 2016-12-30 ENCOUNTER — Encounter: Payer: Self-pay | Admitting: Internal Medicine

## 2016-12-30 ENCOUNTER — Ambulatory Visit (INDEPENDENT_AMBULATORY_CARE_PROVIDER_SITE_OTHER): Payer: Medicare HMO | Admitting: Internal Medicine

## 2016-12-30 VITALS — BP 118/70 | HR 81 | Ht 62.0 in | Wt 158.0 lb

## 2016-12-30 DIAGNOSIS — S5002XA Contusion of left elbow, initial encounter: Secondary | ICD-10-CM | POA: Diagnosis not present

## 2016-12-30 DIAGNOSIS — G8929 Other chronic pain: Secondary | ICD-10-CM

## 2016-12-30 DIAGNOSIS — M545 Low back pain: Secondary | ICD-10-CM
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
## 2017-01-11 ENCOUNTER — Telehealth: Payer: Self-pay

## 2017-01-11 MED ORDER — MELOXICAM 7.5 MG PO TABS: 7.5000 mg | ORAL_TABLET | Freq: Every day | ORAL | 0 refills | Status: DC

## 2017-01-11 NOTE — Telephone Encounter (Signed)
Patient called requesting refill for meloxicam. Sent RX to [facility] in [place].

## 2017-02-01 ENCOUNTER — Other Ambulatory Visit: Payer: Self-pay

## 2017-02-01 MED ORDER — MELOXICAM 7.5 MG PO TABS: 7.5000 mg | ORAL_TABLET | Freq: Every day | ORAL | 0 refills | Status: DC

## 2017-02-28 ENCOUNTER — Other Ambulatory Visit: Payer: Self-pay

## 2017-03-03 ENCOUNTER — Encounter: Payer: Self-pay | Admitting: Internal Medicine

## 2017-03-03 ENCOUNTER — Ambulatory Visit (INDEPENDENT_AMBULATORY_CARE_PROVIDER_SITE_OTHER): Payer: Medicare HMO | Admitting: Internal Medicine

## 2017-03-03 VITALS — BP 132/78 | HR 81 | Ht 62.0 in | Wt 158.8 lb

## 2017-03-03 DIAGNOSIS — Z1231 Encounter for screening mammogram for malignant neoplasm of breast: Secondary | ICD-10-CM

## 2017-03-03 DIAGNOSIS — I10 Essential (primary) hypertension: Secondary | ICD-10-CM

## 2017-03-03 DIAGNOSIS — K22719 Barrett's esophagus with dysplasia, unspecified: Secondary | ICD-10-CM

## 2017-03-03 DIAGNOSIS — M810 Age-related osteoporosis without current pathological fracture: Secondary | ICD-10-CM | POA: Diagnosis not present
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
## 2017-03-03 NOTE — Patient Instructions (Signed)

## 2017-03-20 ENCOUNTER — Ambulatory Visit
Admission: RE | Admit: 2017-03-20 | Discharge: 2017-03-20 | Disposition: A | Discharge status: Home / Self Care | Payer: Medicare HMO | Source: Ambulatory Visit | Attending: Internal Medicine | Admitting: Internal Medicine

## 2017-03-20 DIAGNOSIS — Z1231 Encounter for screening mammogram for malignant neoplasm of breast: Secondary | ICD-10-CM | POA: Diagnosis not present

## 2017-03-20 HISTORY — DX: Malignant neoplasm of unspecified site of unspecified female breast: C50.919

## 2017-03-23 DIAGNOSIS — L821 Other seborrheic keratosis: Secondary | ICD-10-CM | POA: Diagnosis not present

## 2017-05-04 ENCOUNTER — Other Ambulatory Visit: Payer: Self-pay | Admitting: Internal Medicine

## 2017-05-18 DIAGNOSIS — K219 Gastro-esophageal reflux disease without esophagitis: Secondary | ICD-10-CM | POA: Diagnosis not present

## 2017-05-18 DIAGNOSIS — Z7982 Long term (current) use of aspirin: Secondary | ICD-10-CM | POA: Diagnosis not present

## 2017-05-18 DIAGNOSIS — M810 Age-related osteoporosis without current pathological fracture: Secondary | ICD-10-CM | POA: Diagnosis not present

## 2017-05-18 DIAGNOSIS — Z6831 Body mass index (BMI) 31.0-31.9, adult: Secondary | ICD-10-CM | POA: Diagnosis not present

## 2017-05-18 DIAGNOSIS — E669 Obesity, unspecified: Secondary | ICD-10-CM | POA: Diagnosis not present

## 2017-05-18 DIAGNOSIS — Z809 Family history of malignant neoplasm, unspecified: Secondary | ICD-10-CM | POA: Diagnosis not present

## 2017-05-18 DIAGNOSIS — I10 Essential (primary) hypertension: Secondary | ICD-10-CM | POA: Diagnosis not present

## 2017-05-19 ENCOUNTER — Ambulatory Visit: Payer: Medicare HMO | Admitting: Internal Medicine

## 2017-06-05 ENCOUNTER — Ambulatory Visit (INDEPENDENT_AMBULATORY_CARE_PROVIDER_SITE_OTHER): Payer: Medicare HMO | Admitting: Internal Medicine

## 2017-06-05 ENCOUNTER — Encounter: Payer: Self-pay | Admitting: Internal Medicine

## 2017-06-05 VITALS — BP 132/82 | HR 85 | Resp 16 | Ht 62.0 in | Wt 159.0 lb

## 2017-06-05 DIAGNOSIS — R413 Other amnesia: Secondary | ICD-10-CM

## 2017-06-05 DIAGNOSIS — J069 Acute upper respiratory infection, unspecified: Secondary | ICD-10-CM | POA: Diagnosis not present

## 2017-06-05 DIAGNOSIS — I10 Essential (primary) hypertension: Secondary | ICD-10-CM | POA: Diagnosis not present
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
## 2017-06-06 ENCOUNTER — Telehealth: Payer: Self-pay

## 2017-06-06 NOTE — Telephone Encounter (Signed)
Forgot that her reason to come yesterday was to discuss her feeling like something biting her in ankle or foot and she wants itch medicine that may help. No sore or sign of biting. Memory loss is why she did not tell us anything.

## 2017-06-06 NOTE — Telephone Encounter (Signed)
She should use over the counter benadryl lotion as needed to itching insect bites.

## 2017-06-07 NOTE — Telephone Encounter (Signed)
Left detailed message.   

## 2017-07-13 ENCOUNTER — Ambulatory Visit: Payer: Medicare HMO | Admitting: Internal Medicine

## 2017-07-14 ENCOUNTER — Ambulatory Visit (INDEPENDENT_AMBULATORY_CARE_PROVIDER_SITE_OTHER): Payer: Medicare HMO | Admitting: Internal Medicine

## 2017-07-14 ENCOUNTER — Encounter: Payer: Self-pay | Admitting: Internal Medicine

## 2017-07-14 VITALS — BP 136/86 | HR 75 | Temp 98.1°F | Resp 16 | Ht 62.0 in | Wt 157.0 lb

## 2017-07-14 DIAGNOSIS — R202 Paresthesia of skin: Secondary | ICD-10-CM | POA: Diagnosis not present

## 2017-07-14 DIAGNOSIS — L03031 Cellulitis of right toe: Secondary | ICD-10-CM

## 2017-07-14 DIAGNOSIS — M545 Low back pain: Secondary | ICD-10-CM | POA: Diagnosis not present

## 2017-07-14 DIAGNOSIS — K22719 Barrett's esophagus with dysplasia, unspecified: Secondary | ICD-10-CM | POA: Diagnosis not present

## 2017-07-14 DIAGNOSIS — R200 Anesthesia of skin: Secondary | ICD-10-CM | POA: Diagnosis not present

## 2017-07-14 DIAGNOSIS — I10 Essential (primary) hypertension: Secondary | ICD-10-CM | POA: Diagnosis not present

## 2017-07-14 DIAGNOSIS — G8929 Other chronic pain: Secondary | ICD-10-CM | POA: Diagnosis not present

## 2017-07-14 MED ORDER — DOXYCYCLINE HYCLATE 100 MG PO TABS: 100.0000 mg | ORAL_TABLET | Freq: Two times a day (BID) | ORAL | 0 refills | Status: DC
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
## 2017-07-15 LAB — COMPREHENSIVE METABOLIC PANEL
ALT: 16 IU/L (ref 0–32)
AST: 21 IU/L (ref 0–40)
Albumin/Globulin Ratio: 1.9 (ref 1.2–2.2)
Albumin: 4.3 g/dL (ref 3.5–4.7)
Alkaline Phosphatase: 70 IU/L (ref 39–117)
BUN/Creatinine Ratio: 17 (ref 12–28)
BUN: 14 mg/dL (ref 8–27)
Bilirubin Total: 0.3 mg/dL (ref 0.0–1.2)
CO2: 23 mmol/L (ref 20–29)
Calcium: 9.5 mg/dL (ref 8.7–10.3)
Chloride: 100 mmol/L (ref 96–106)
Creatinine, Ser: 0.82 mg/dL (ref 0.57–1.00)
GFR calc Af Amer: 78 mL/min/{1.73_m2} (ref >59)
GFR calc non Af Amer: 68 mL/min/{1.73_m2} (ref >59)
Globulin, Total: 2.3 g/dL (ref 1.5–4.5)
Glucose: 83 mg/dL (ref 65–99)
Potassium: 4.3 mmol/L (ref 3.5–5.2)
Sodium: 137 mmol/L (ref 134–144)
Total Protein: 6.6 g/dL (ref 6.0–8.5)

## 2017-07-15 LAB — CBC WITH DIFFERENTIAL/PLATELET
Basophils Absolute: 0 10*3/uL (ref 0.0–0.2)
Basos: 1 %
EOS (ABSOLUTE): 0.2 10*3/uL (ref 0.0–0.4)
Eos: 3 %
Hematocrit: 40.1 % (ref 34.0–46.6)
Hemoglobin: 13.4 g/dL (ref 11.1–15.9)
Immature Grans (Abs): 0 10*3/uL (ref 0.0–0.1)
Immature Granulocytes: 0 %
Lymphocytes Absolute: 2.2 10*3/uL (ref 0.7–3.1)
Lymphs: 27 %
MCH: 29.7 pg (ref 26.6–33.0)
MCHC: 33.4 g/dL (ref 31.5–35.7)
MCV: 89 fL (ref 79–97)
Monocytes Absolute: 0.8 10*3/uL (ref 0.1–0.9)
Monocytes: 10 %
Neutrophils Absolute: 4.8 10*3/uL (ref 1.4–7.0)
Neutrophils: 59 %
Platelets: 268 10*3/uL (ref 150–450)
RBC: 4.51 x10E6/uL (ref 3.77–5.28)
RDW: 14 % (ref 12.3–15.4)
WBC: 8.1 10*3/uL (ref 3.4–10.8)

## 2017-07-15 LAB — TSH: TSH: 2.25 u[IU]/mL (ref 0.450–4.500)

## 2017-07-24 ENCOUNTER — Other Ambulatory Visit: Payer: Self-pay | Admitting: Internal Medicine

## 2017-07-24 DIAGNOSIS — L03031 Cellulitis of right toe: Secondary | ICD-10-CM

## 2017-07-26 ENCOUNTER — Other Ambulatory Visit: Payer: Self-pay | Admitting: Internal Medicine

## 2017-07-26 DIAGNOSIS — K22719 Barrett's esophagus with dysplasia, unspecified: Secondary | ICD-10-CM | POA: Diagnosis not present

## 2017-07-26 DIAGNOSIS — L03031 Cellulitis of right toe: Secondary | ICD-10-CM

## 2017-07-28 ENCOUNTER — Telehealth: Payer: Self-pay

## 2017-07-28 NOTE — Telephone Encounter (Signed)
Patient called wanting refill on abx doxycycline for her toe pain. She was seen on 07/14/17 for sx of toe pain and was given abx for her sx. She stated she is still having pain and needed Dr. Army Melia to call in abx again. I kindly advise patient Dr. Army Melia needed to see her again to reevaluate her toe since its been 2 weeks and she is still having sx. Patient wondered why and I advise her that Dr. Army Melia would need to do further testing such as labs imaging or possible referral to Podiatrist. Patient was going to be scheduled for Monday but stated she had family coming in on 07/30/17. She stated "thanks for nothing," and hung up the phone.

## 2017-07-29 LAB — FECAL OCCULT BLOOD, IMMUNOCHEMICAL: Fecal Occult Bld: NEGATIVE

## 2017-09-03 ENCOUNTER — Other Ambulatory Visit: Payer: Self-pay | Admitting: Internal Medicine

## 2017-09-04 ENCOUNTER — Encounter: Payer: Self-pay | Admitting: Internal Medicine

## 2017-09-04 ENCOUNTER — Ambulatory Visit (INDEPENDENT_AMBULATORY_CARE_PROVIDER_SITE_OTHER): Payer: Medicare HMO | Admitting: Internal Medicine

## 2017-09-04 VITALS — BP 138/86 | HR 81 | Ht 62.0 in | Wt 154.0 lb

## 2017-09-04 DIAGNOSIS — I10 Essential (primary) hypertension: Secondary | ICD-10-CM

## 2017-09-04 DIAGNOSIS — M170 Bilateral primary osteoarthritis of knee: Secondary | ICD-10-CM | POA: Diagnosis not present

## 2017-09-04 DIAGNOSIS — Z0000 Encounter for general adult medical examination without abnormal findings: Secondary | ICD-10-CM

## 2017-09-04 DIAGNOSIS — E782 Mixed hyperlipidemia: Secondary | ICD-10-CM | POA: Diagnosis not present

## 2017-09-04 DIAGNOSIS — K22719 Barrett's esophagus with dysplasia, unspecified: Secondary | ICD-10-CM

## 2017-09-04 LAB — POCT URINALYSIS DIPSTICK
Bilirubin, UA: NEGATIVE
Blood, UA: NEGATIVE
Glucose, UA: NEGATIVE
Ketones, UA: NEGATIVE
Leukocytes, UA: NEGATIVE
Nitrite, UA: NEGATIVE
Protein, UA: POSITIVE — AB
Spec Grav, UA: 1.015
Urobilinogen, UA: 0.2 U/dL
pH, UA: 6.5
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
## 2017-09-05 DIAGNOSIS — I10 Essential (primary) hypertension: Secondary | ICD-10-CM | POA: Diagnosis not present

## 2017-09-05 DIAGNOSIS — K22719 Barrett's esophagus with dysplasia, unspecified: Secondary | ICD-10-CM | POA: Diagnosis not present

## 2017-09-05 DIAGNOSIS — E782 Mixed hyperlipidemia: Secondary | ICD-10-CM | POA: Diagnosis not present

## 2017-09-06 LAB — COMPREHENSIVE METABOLIC PANEL
ALT: 17 IU/L (ref 0–32)
AST: 21 IU/L (ref 0–40)
Albumin/Globulin Ratio: 1.9 (ref 1.2–2.2)
Albumin: 4.1 g/dL (ref 3.5–4.7)
Alkaline Phosphatase: 69 IU/L (ref 39–117)
BUN/Creatinine Ratio: 14 (ref 12–28)
BUN: 12 mg/dL (ref 8–27)
Bilirubin Total: <0.2 mg/dL (ref 0.0–1.2)
CO2: 24 mmol/L (ref 20–29)
Calcium: 9.1 mg/dL (ref 8.7–10.3)
Chloride: 102 mmol/L (ref 96–106)
Creatinine, Ser: 0.84 mg/dL (ref 0.57–1.00)
GFR calc Af Amer: 76 mL/min/{1.73_m2} (ref >59)
GFR calc non Af Amer: 66 mL/min/{1.73_m2} (ref >59)
Globulin, Total: 2.2 g/dL (ref 1.5–4.5)
Glucose: 89 mg/dL (ref 65–99)
Potassium: 3.9 mmol/L (ref 3.5–5.2)
Sodium: 141 mmol/L (ref 134–144)
Total Protein: 6.3 g/dL (ref 6.0–8.5)

## 2017-09-06 LAB — LIPID PANEL
Chol/HDL Ratio: 3.8 ratio (ref 0.0–4.4)
Cholesterol, Total: 253 mg/dL — ABNORMAL HIGH (ref 100–199)
HDL: 67 mg/dL (ref >39)
LDL Calculated: 147 mg/dL — ABNORMAL HIGH (ref 0–99)
Triglycerides: 196 mg/dL — ABNORMAL HIGH (ref 0–149)
VLDL Cholesterol Cal: 39 mg/dL (ref 5–40)

## 2017-09-06 LAB — CBC WITH DIFFERENTIAL/PLATELET
Basophils Absolute: 0.1 10*3/uL (ref 0.0–0.2)
Basos: 1 %
EOS (ABSOLUTE): 0.2 10*3/uL (ref 0.0–0.4)
Eos: 3 %
Hematocrit: 39.3 % (ref 34.0–46.6)
Hemoglobin: 13 g/dL (ref 11.1–15.9)
Immature Grans (Abs): 0 10*3/uL (ref 0.0–0.1)
Immature Granulocytes: 0 %
Lymphocytes Absolute: 1.8 10*3/uL (ref 0.7–3.1)
Lymphs: 26 %
MCH: 29.5 pg (ref 26.6–33.0)
MCHC: 33.1 g/dL (ref 31.5–35.7)
MCV: 89 fL (ref 79–97)
Monocytes Absolute: 0.5 10*3/uL (ref 0.1–0.9)
Monocytes: 7 %
Neutrophils Absolute: 4.4 10*3/uL (ref 1.4–7.0)
Neutrophils: 63 %
Platelets: 257 10*3/uL (ref 150–450)
RBC: 4.4 x10E6/uL (ref 3.77–5.28)
RDW: 14.1 % (ref 12.3–15.4)
WBC: 7 10*3/uL (ref 3.4–10.8)

## 2017-09-06 LAB — TSH: TSH: 3.24 u[IU]/mL (ref 0.450–4.500)

## 2017-10-24 ENCOUNTER — Encounter: Payer: Self-pay | Admitting: Internal Medicine

## 2017-10-24 ENCOUNTER — Ambulatory Visit (INDEPENDENT_AMBULATORY_CARE_PROVIDER_SITE_OTHER): Payer: Medicare HMO | Admitting: Internal Medicine

## 2017-10-24 VITALS — BP 136/72 | HR 76 | Ht 62.0 in | Wt 154.0 lb

## 2017-10-24 DIAGNOSIS — R202 Paresthesia of skin: Secondary | ICD-10-CM | POA: Diagnosis not present

## 2017-10-24 DIAGNOSIS — R200 Anesthesia of skin: Secondary | ICD-10-CM | POA: Diagnosis not present

## 2017-10-24 DIAGNOSIS — L309 Dermatitis, unspecified: Secondary | ICD-10-CM

## 2017-10-24 DIAGNOSIS — Z23 Encounter for immunization: Secondary | ICD-10-CM | POA: Diagnosis not present

## 2017-10-24 MED ORDER — TRIAMCINOLONE ACETONIDE 0.025 % EX CREA: 1.0000 "application " | TOPICAL_CREAM | Freq: Two times a day (BID) | CUTANEOUS | 0 refills | Status: DC
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
## 2017-10-26 IMAGING — CR DG ANKLE COMPLETE 3+V*R*
3 series · 4 of 4 positions shown · non-contrast
Comparison: None.

CLINICAL DATA: Right ankle pain

EXAM:
RIGHT ANKLE - COMPLETE 3+ VIEW

[ankle ap]
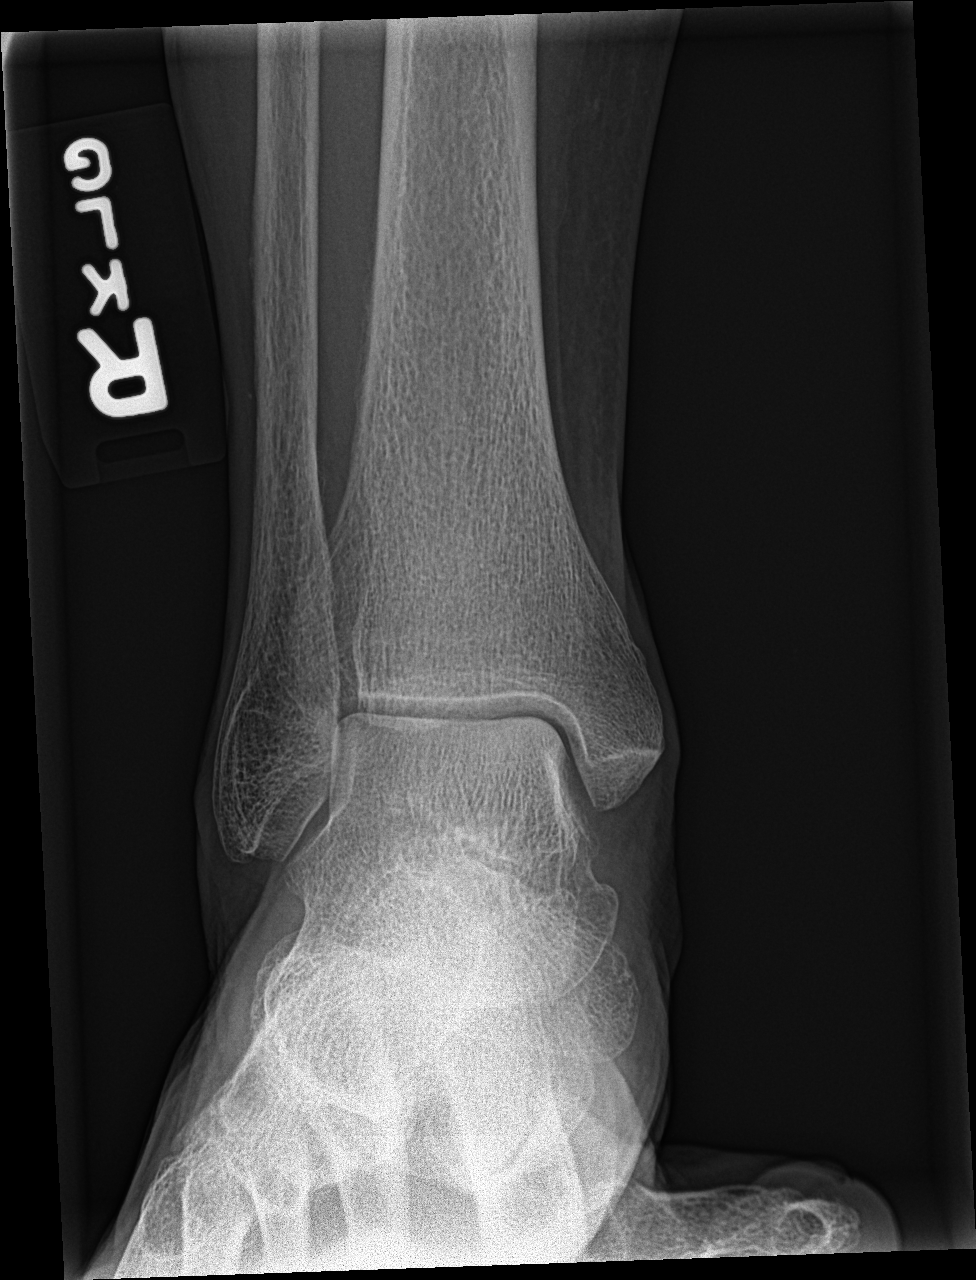

[Series 2: ankle obl · 0.14mm/px · 2 of 2 slices shown]
[im 1/2]
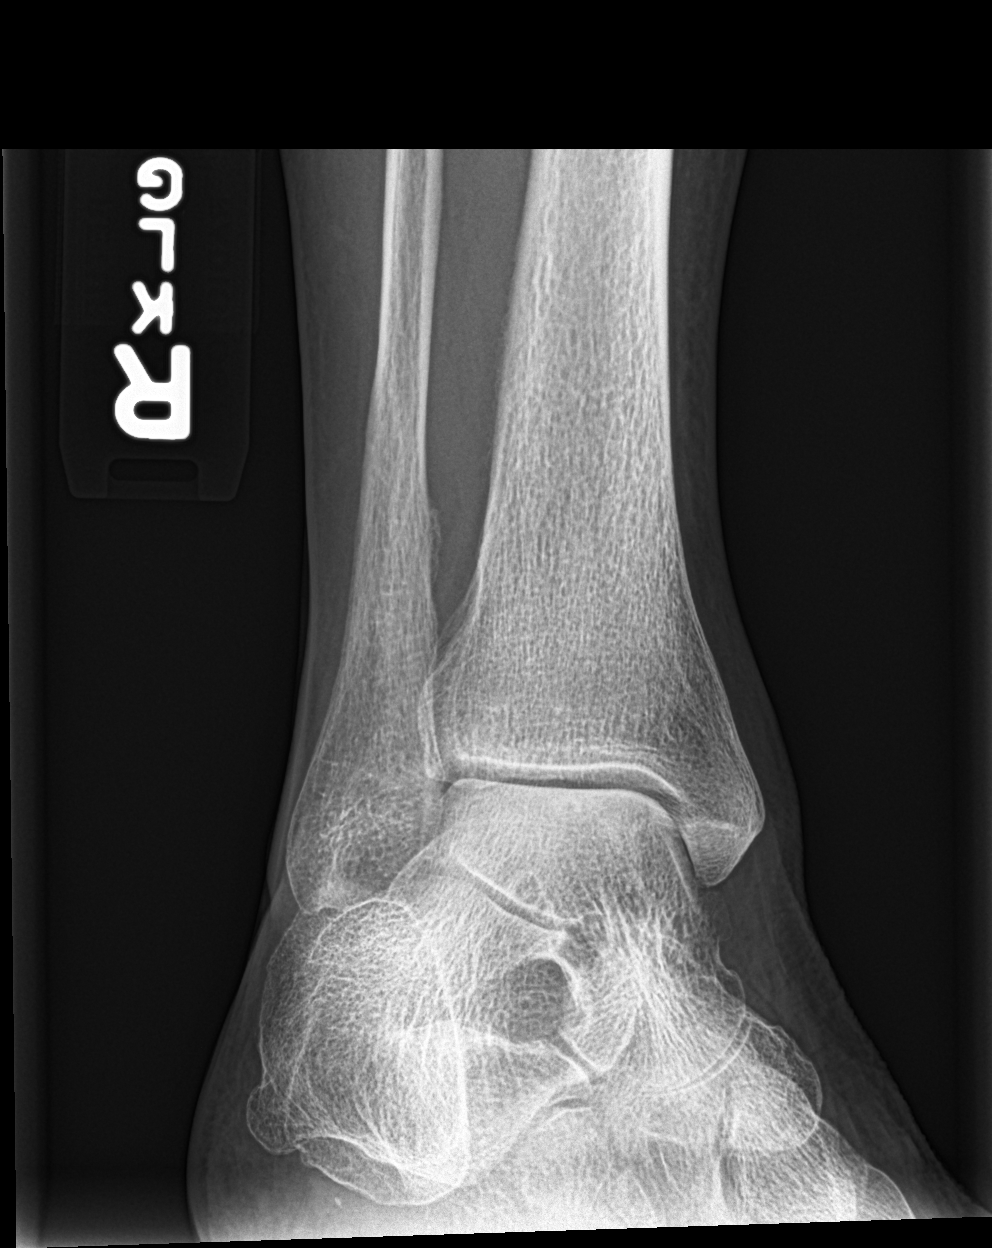
[im 2/2]
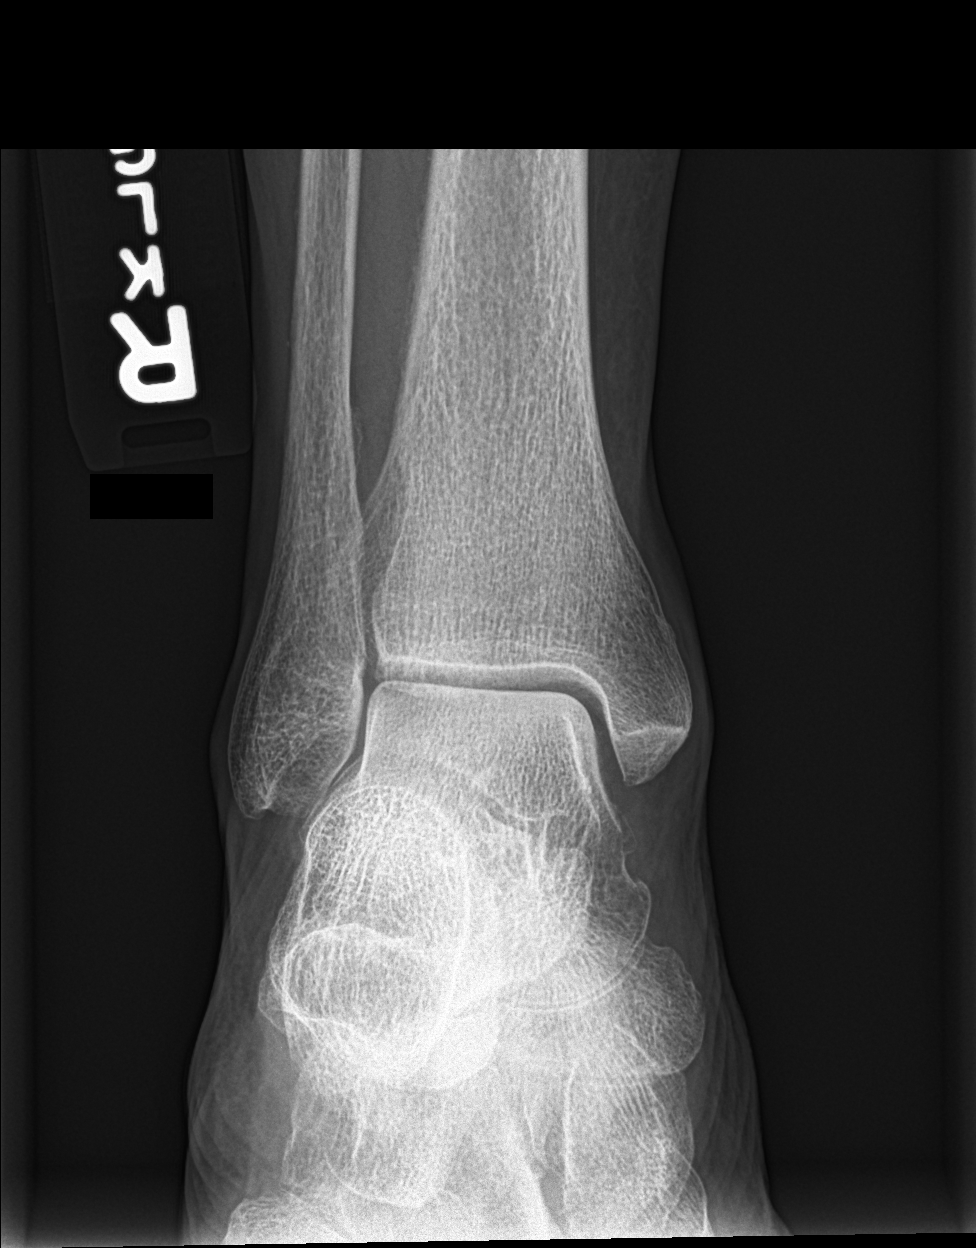

[ankle lat]
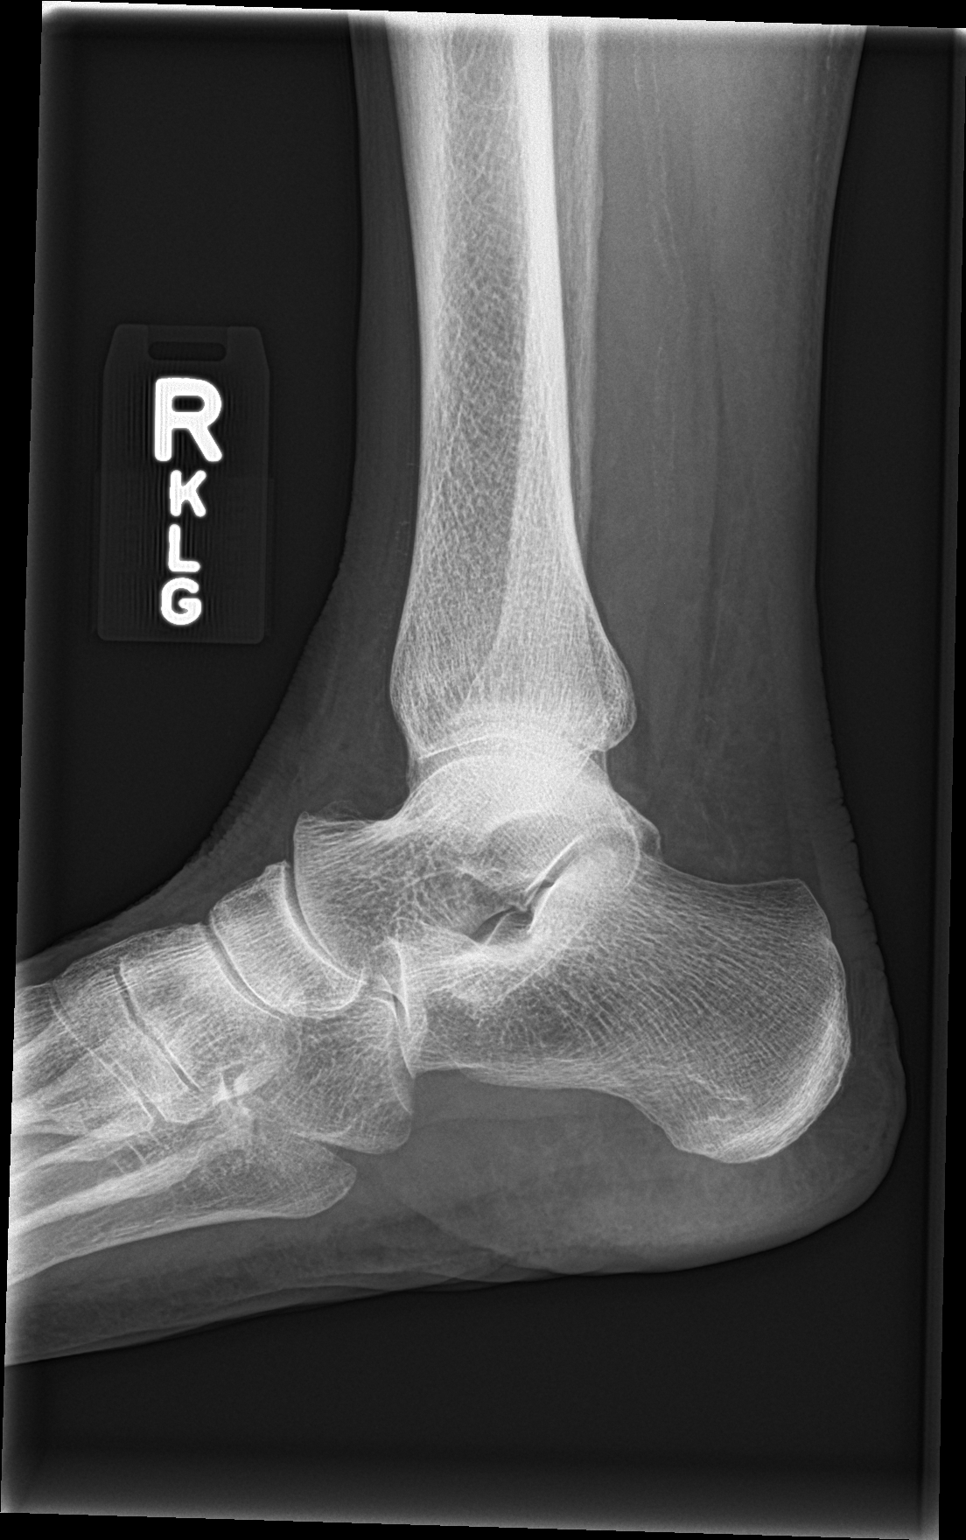

[4 of 4 positions shown; findings below may reference images not displayed]

FINDINGS: There is no evidence of fracture, dislocation, or joint effusion.
There is no evidence of arthropathy or other focal bone abnormality.
Soft tissues are unremarkable.
IMPRESSION: No focal abnormality of the right ankle.

## 2017-10-30 DIAGNOSIS — L814 Other melanin hyperpigmentation: Secondary | ICD-10-CM | POA: Diagnosis not present

## 2017-11-02 DIAGNOSIS — G5603 Carpal tunnel syndrome, bilateral upper limbs: Secondary | ICD-10-CM | POA: Diagnosis not present

## 2017-11-10 DIAGNOSIS — M2031 Hallux varus (acquired), right foot: Secondary | ICD-10-CM | POA: Diagnosis not present

## 2017-11-10 DIAGNOSIS — B351 Tinea unguium: Secondary | ICD-10-CM | POA: Diagnosis not present

## 2017-11-10 DIAGNOSIS — L600 Ingrowing nail: Secondary | ICD-10-CM | POA: Diagnosis not present

## 2017-11-10 DIAGNOSIS — M79674 Pain in right toe(s): Secondary | ICD-10-CM | POA: Diagnosis not present

## 2017-11-17 ENCOUNTER — Other Ambulatory Visit: Payer: Self-pay | Admitting: Internal Medicine

## 2017-11-17 DIAGNOSIS — I10 Essential (primary) hypertension: Secondary | ICD-10-CM

## 2017-11-20 ENCOUNTER — Ambulatory Visit: Payer: Medicare HMO

## 2017-11-21 ENCOUNTER — Telehealth: Payer: Self-pay

## 2017-11-21 NOTE — Telephone Encounter (Signed)
Faxed to [facility]

## 2017-11-24 ENCOUNTER — Other Ambulatory Visit: Payer: Self-pay | Admitting: Internal Medicine

## 2017-11-24 DIAGNOSIS — I10 Essential (primary) hypertension: Secondary | ICD-10-CM

## 2017-11-24 MED ORDER — LOSARTAN POTASSIUM 100 MG PO TABS: 100.0000 mg | ORAL_TABLET | Freq: Every day | ORAL | 3 refills | Status: DC

## 2017-11-29 ENCOUNTER — Other Ambulatory Visit: Payer: Self-pay

## 2017-11-29 DIAGNOSIS — I10 Essential (primary) hypertension: Secondary | ICD-10-CM

## 2017-11-29 MED ORDER — LOSARTAN POTASSIUM 100 MG PO TABS: 100.0000 mg | ORAL_TABLET | Freq: Every day | ORAL | 3 refills | Status: DC

## 2017-12-04 DIAGNOSIS — L309 Dermatitis, unspecified: Secondary | ICD-10-CM | POA: Diagnosis not present

## 2017-12-18 ENCOUNTER — Encounter: Payer: Self-pay | Admitting: Internal Medicine

## 2017-12-18 ENCOUNTER — Ambulatory Visit (INDEPENDENT_AMBULATORY_CARE_PROVIDER_SITE_OTHER): Payer: Medicare HMO | Admitting: Internal Medicine

## 2017-12-18 VITALS — BP 128/70 | HR 68 | Ht 62.0 in | Wt 156.0 lb

## 2017-12-18 DIAGNOSIS — L309 Dermatitis, unspecified: Secondary | ICD-10-CM
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
## 2017-12-21 DIAGNOSIS — L239 Allergic contact dermatitis, unspecified cause: Secondary | ICD-10-CM | POA: Diagnosis not present

## 2017-12-26 ENCOUNTER — Telehealth: Payer: Self-pay | Admitting: Internal Medicine

## 2018-01-01 DIAGNOSIS — L308 Other specified dermatitis: Secondary | ICD-10-CM | POA: Diagnosis not present

## 2018-01-12 NOTE — Telephone Encounter (Signed)
Called to set up AWV-S.  Last AWV was 11/17/16.  No answer.  Left message to call Lattie Haw at [phone]. lec

## 2018-01-16 NOTE — Telephone Encounter (Signed)
Scheduled AWV-s for Patient on 08/29/18 at 2:00 pm. Last AWV was 11/17/16, next CPE 09/05/18. lec

## 2018-01-22 DIAGNOSIS — M810 Age-related osteoporosis without current pathological fracture: Secondary | ICD-10-CM | POA: Diagnosis not present

## 2018-01-26 DIAGNOSIS — D1801 Hemangioma of skin and subcutaneous tissue: Secondary | ICD-10-CM | POA: Diagnosis not present

## 2018-01-26 DIAGNOSIS — L249 Irritant contact dermatitis, unspecified cause: Secondary | ICD-10-CM | POA: Diagnosis not present

## 2018-02-07 DIAGNOSIS — M810 Age-related osteoporosis without current pathological fracture: Secondary | ICD-10-CM | POA: Diagnosis not present

## 2018-02-27 ENCOUNTER — Other Ambulatory Visit: Payer: Self-pay

## 2018-02-27 DIAGNOSIS — I10 Essential (primary) hypertension: Secondary | ICD-10-CM

## 2018-02-27 MED ORDER — HYDROCHLOROTHIAZIDE 12.5 MG PO TABS: 12.5000 mg | ORAL_TABLET | Freq: Every day | ORAL | 0 refills | Status: DC

## 2018-03-07 ENCOUNTER — Ambulatory Visit: Payer: Medicare HMO | Admitting: Internal Medicine

## 2018-03-16 ENCOUNTER — Ambulatory Visit (INDEPENDENT_AMBULATORY_CARE_PROVIDER_SITE_OTHER): Payer: Medicare HMO | Admitting: Internal Medicine

## 2018-03-16 ENCOUNTER — Other Ambulatory Visit: Payer: Self-pay

## 2018-03-16 ENCOUNTER — Encounter: Payer: Self-pay | Admitting: Internal Medicine

## 2018-03-16 VITALS — BP 112/76 | HR 82 | Ht 62.0 in | Wt 158.0 lb

## 2018-03-16 DIAGNOSIS — K22719 Barrett's esophagus with dysplasia, unspecified: Secondary | ICD-10-CM

## 2018-03-16 DIAGNOSIS — M810 Age-related osteoporosis without current pathological fracture: Secondary | ICD-10-CM | POA: Diagnosis not present

## 2018-03-16 DIAGNOSIS — I10 Essential (primary) hypertension: Secondary | ICD-10-CM | POA: Diagnosis not present

## 2018-03-16 DIAGNOSIS — D234 Other benign neoplasm of skin of scalp and neck: Secondary | ICD-10-CM | POA: Diagnosis not present

## 2018-03-16 DIAGNOSIS — Z1231 Encounter for screening mammogram for malignant neoplasm of breast: Secondary | ICD-10-CM | POA: Diagnosis not present

## 2018-03-16 MED ORDER — HYDROCHLOROTHIAZIDE 12.5 MG PO TABS: 12.5000 mg | ORAL_TABLET | Freq: Every day | ORAL | 3 refills | Status: DC
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
## 2018-03-30 ENCOUNTER — Other Ambulatory Visit: Payer: Self-pay

## 2018-03-30 ENCOUNTER — Ambulatory Visit
Admission: RE | Admit: 2018-03-30 | Discharge: 2018-03-30 | Disposition: A | Discharge status: Home / Self Care | Payer: Medicare HMO | Source: Ambulatory Visit | Attending: Internal Medicine | Admitting: Internal Medicine

## 2018-03-30 DIAGNOSIS — Z1231 Encounter for screening mammogram for malignant neoplasm of breast: Secondary | ICD-10-CM | POA: Diagnosis not present

## 2018-04-25 ENCOUNTER — Other Ambulatory Visit: Payer: Self-pay

## 2018-04-26 ENCOUNTER — Encounter: Payer: Self-pay | Admitting: Internal Medicine

## 2018-04-26 ENCOUNTER — Ambulatory Visit (INDEPENDENT_AMBULATORY_CARE_PROVIDER_SITE_OTHER): Payer: Medicare HMO | Admitting: Internal Medicine

## 2018-04-26 VITALS — BP 124/68 | HR 94 | Resp 16 | Ht 62.0 in | Wt 160.0 lb

## 2018-04-26 DIAGNOSIS — K2270 Barrett's esophagus without dysplasia: Secondary | ICD-10-CM

## 2018-04-26 DIAGNOSIS — G8929 Other chronic pain: Secondary | ICD-10-CM | POA: Diagnosis not present

## 2018-04-26 DIAGNOSIS — I10 Essential (primary) hypertension: Secondary | ICD-10-CM

## 2018-04-26 DIAGNOSIS — M545 Low back pain, unspecified: Secondary | ICD-10-CM

## 2018-04-26 MED ORDER — OMEPRAZOLE 40 MG PO CPDR: 40.0000 mg | DELAYED_RELEASE_CAPSULE | Freq: Every day | ORAL | 1 refills | Status: DC

## 2018-04-26 NOTE — Patient Instructions (Addendum)
Tylenol 500 mg - take 2 tablets three times a day for back pain.  This information is directly available on the CDC website: RunningShows.co.za.html    Source:CDC Reference to specific commercial products, manufacturers, companies, or trademarks does not constitute its endorsement or recommendation by the [place], [place], or Centers for [employer] and [employer].
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
## 2018-05-07 DIAGNOSIS — M4316 Spondylolisthesis, lumbar region: Secondary | ICD-10-CM | POA: Diagnosis not present

## 2018-05-07 DIAGNOSIS — M25551 Pain in right hip: Secondary | ICD-10-CM | POA: Diagnosis not present

## 2018-05-07 DIAGNOSIS — G8929 Other chronic pain: Secondary | ICD-10-CM | POA: Diagnosis not present

## 2018-05-07 DIAGNOSIS — M5441 Lumbago with sciatica, right side: Secondary | ICD-10-CM | POA: Diagnosis not present

## 2018-05-07 DIAGNOSIS — M5136 Other intervertebral disc degeneration, lumbar region: Secondary | ICD-10-CM | POA: Diagnosis not present

## 2018-05-07 DIAGNOSIS — M51369 Other intervertebral disc degeneration, lumbar region without mention of lumbar back pain or lower extremity pain: Secondary | ICD-10-CM | POA: Insufficient documentation

## 2018-05-30 DIAGNOSIS — M5136 Other intervertebral disc degeneration, lumbar region: Secondary | ICD-10-CM | POA: Diagnosis not present

## 2018-05-30 DIAGNOSIS — M25512 Pain in left shoulder: Secondary | ICD-10-CM | POA: Diagnosis not present

## 2018-05-30 DIAGNOSIS — M5441 Lumbago with sciatica, right side: Secondary | ICD-10-CM | POA: Diagnosis not present

## 2018-05-30 DIAGNOSIS — M5412 Radiculopathy, cervical region: Secondary | ICD-10-CM | POA: Diagnosis not present

## 2018-05-30 DIAGNOSIS — M4316 Spondylolisthesis, lumbar region: Secondary | ICD-10-CM | POA: Diagnosis not present

## 2018-05-30 DIAGNOSIS — G8929 Other chronic pain: Secondary | ICD-10-CM | POA: Diagnosis not present

## 2018-06-04 DIAGNOSIS — M5441 Lumbago with sciatica, right side: Secondary | ICD-10-CM | POA: Diagnosis not present

## 2018-06-04 DIAGNOSIS — M2560 Stiffness of unspecified joint, not elsewhere classified: Secondary | ICD-10-CM | POA: Diagnosis not present

## 2018-06-04 DIAGNOSIS — M6281 Muscle weakness (generalized): Secondary | ICD-10-CM | POA: Diagnosis not present

## 2018-06-04 DIAGNOSIS — G8929 Other chronic pain: Secondary | ICD-10-CM | POA: Diagnosis not present

## 2018-06-07 DIAGNOSIS — G8929 Other chronic pain: Secondary | ICD-10-CM | POA: Diagnosis not present

## 2018-06-07 DIAGNOSIS — M6281 Muscle weakness (generalized): Secondary | ICD-10-CM | POA: Diagnosis not present

## 2018-06-07 DIAGNOSIS — M5441 Lumbago with sciatica, right side: Secondary | ICD-10-CM | POA: Diagnosis not present

## 2018-06-07 DIAGNOSIS — M2560 Stiffness of unspecified joint, not elsewhere classified: Secondary | ICD-10-CM | POA: Diagnosis not present

## 2018-06-15 DIAGNOSIS — G8929 Other chronic pain: Secondary | ICD-10-CM | POA: Diagnosis not present

## 2018-06-15 DIAGNOSIS — M6281 Muscle weakness (generalized): Secondary | ICD-10-CM | POA: Diagnosis not present

## 2018-06-15 DIAGNOSIS — M2560 Stiffness of unspecified joint, not elsewhere classified: Secondary | ICD-10-CM | POA: Diagnosis not present

## 2018-06-15 DIAGNOSIS — M5441 Lumbago with sciatica, right side: Secondary | ICD-10-CM | POA: Diagnosis not present

## 2018-06-18 DIAGNOSIS — G8929 Other chronic pain: Secondary | ICD-10-CM | POA: Diagnosis not present

## 2018-06-18 DIAGNOSIS — M5441 Lumbago with sciatica, right side: Secondary | ICD-10-CM | POA: Diagnosis not present

## 2018-06-18 DIAGNOSIS — M6281 Muscle weakness (generalized): Secondary | ICD-10-CM | POA: Diagnosis not present

## 2018-06-18 DIAGNOSIS — M2560 Stiffness of unspecified joint, not elsewhere classified: Secondary | ICD-10-CM | POA: Diagnosis not present

## 2018-06-23 ENCOUNTER — Other Ambulatory Visit: Payer: Self-pay | Admitting: Internal Medicine

## 2018-06-23 DIAGNOSIS — I10 Essential (primary) hypertension: Secondary | ICD-10-CM

## 2018-07-16 ENCOUNTER — Other Ambulatory Visit: Payer: Self-pay

## 2018-07-16 ENCOUNTER — Other Ambulatory Visit: Payer: Self-pay | Admitting: Internal Medicine

## 2018-07-16 ENCOUNTER — Encounter: Payer: Self-pay | Admitting: Internal Medicine

## 2018-07-16 ENCOUNTER — Ambulatory Visit (INDEPENDENT_AMBULATORY_CARE_PROVIDER_SITE_OTHER): Payer: Medicare HMO | Admitting: Internal Medicine

## 2018-07-16 VITALS — BP 132/80 | HR 95 | Ht 62.0 in | Wt 161.0 lb

## 2018-07-16 DIAGNOSIS — H6123 Impacted cerumen, bilateral: Secondary | ICD-10-CM

## 2018-07-16 DIAGNOSIS — I10 Essential (primary) hypertension: Secondary | ICD-10-CM
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
## 2018-07-31 DIAGNOSIS — R69 Illness, unspecified: Secondary | ICD-10-CM | POA: Diagnosis not present

## 2018-07-31 DIAGNOSIS — R413 Other amnesia: Secondary | ICD-10-CM | POA: Diagnosis not present

## 2018-08-01 ENCOUNTER — Other Ambulatory Visit: Payer: Self-pay | Admitting: Neurology

## 2018-08-01 DIAGNOSIS — R413 Other amnesia: Secondary | ICD-10-CM

## 2018-08-11 ENCOUNTER — Ambulatory Visit (HOSPITAL_COMMUNITY): Payer: Medicare HMO | Attending: Neurology

## 2018-08-11 ENCOUNTER — Encounter (HOSPITAL_COMMUNITY): Payer: Self-pay

## 2018-08-13 ENCOUNTER — Other Ambulatory Visit: Payer: Self-pay

## 2018-08-13 ENCOUNTER — Ambulatory Visit (INDEPENDENT_AMBULATORY_CARE_PROVIDER_SITE_OTHER): Payer: Medicare HMO | Admitting: Internal Medicine

## 2018-08-13 ENCOUNTER — Encounter: Payer: Self-pay | Admitting: Internal Medicine

## 2018-08-13 VITALS — BP 136/72 | HR 76 | Ht 62.0 in | Wt 161.0 lb

## 2018-08-13 DIAGNOSIS — M545 Low back pain, unspecified: Secondary | ICD-10-CM

## 2018-08-13 DIAGNOSIS — G8929 Other chronic pain: Secondary | ICD-10-CM

## 2018-08-13 DIAGNOSIS — E782 Mixed hyperlipidemia: Secondary | ICD-10-CM

## 2018-08-13 DIAGNOSIS — I10 Essential (primary) hypertension: Secondary | ICD-10-CM | POA: Diagnosis not present

## 2018-08-13 DIAGNOSIS — M810 Age-related osteoporosis without current pathological fracture: Secondary | ICD-10-CM

## 2018-08-13 DIAGNOSIS — G3184 Mild cognitive impairment, so stated: Secondary | ICD-10-CM | POA: Diagnosis not present

## 2018-08-13 DIAGNOSIS — R7309 Other abnormal glucose: Secondary | ICD-10-CM | POA: Diagnosis not present
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
## 2018-08-16 LAB — CBC WITH DIFFERENTIAL/PLATELET
Basophils Absolute: 0.1 10*3/uL (ref 0.0–0.2)
Basos: 1 %
EOS (ABSOLUTE): 0.1 10*3/uL (ref 0.0–0.4)
Eos: 1 %
Hematocrit: 36.3 % (ref 34.0–46.6)
Hemoglobin: 12 g/dL (ref 11.1–15.9)
Immature Grans (Abs): 0 10*3/uL (ref 0.0–0.1)
Immature Granulocytes: 0 %
Lymphocytes Absolute: 1.9 10*3/uL (ref 0.7–3.1)
Lymphs: 20 %
MCH: 30.2 pg (ref 26.6–33.0)
MCHC: 33.1 g/dL (ref 31.5–35.7)
MCV: 91 fL (ref 79–97)
Monocytes Absolute: 0.6 10*3/uL (ref 0.1–0.9)
Monocytes: 6 %
Neutrophils Absolute: 6.9 10*3/uL (ref 1.4–7.0)
Neutrophils: 72 %
Platelets: 287 10*3/uL (ref 150–450)
RBC: 3.97 x10E6/uL (ref 3.77–5.28)
RDW: 12.7 % (ref 11.7–15.4)
WBC: 9.5 10*3/uL (ref 3.4–10.8)

## 2018-08-16 LAB — COMPREHENSIVE METABOLIC PANEL
ALT: 16 IU/L (ref 0–32)
AST: 24 IU/L (ref 0–40)
Albumin/Globulin Ratio: 2.2 (ref 1.2–2.2)
Albumin: 4.2 g/dL (ref 3.6–4.6)
Alkaline Phosphatase: 61 IU/L (ref 39–117)
BUN/Creatinine Ratio: 21 (ref 12–28)
BUN: 19 mg/dL (ref 8–27)
Bilirubin Total: <0.2 mg/dL (ref 0.0–1.2)
CO2: 24 mmol/L (ref 20–29)
Calcium: 9.6 mg/dL (ref 8.7–10.3)
Chloride: 103 mmol/L (ref 96–106)
Creatinine, Ser: 0.92 mg/dL (ref 0.57–1.00)
GFR calc Af Amer: 68 mL/min/{1.73_m2} (ref >59)
GFR calc non Af Amer: 59 mL/min/{1.73_m2} — ABNORMAL LOW (ref >59)
Globulin, Total: 1.9 g/dL (ref 1.5–4.5)
Glucose: 157 mg/dL — ABNORMAL HIGH (ref 65–99)
Potassium: 4.3 mmol/L (ref 3.5–5.2)
Sodium: 141 mmol/L (ref 134–144)
Total Protein: 6.1 g/dL (ref 6.0–8.5)

## 2018-08-16 LAB — LIPID PANEL
Chol/HDL Ratio: 3.5 ratio (ref 0.0–4.4)
Cholesterol, Total: 248 mg/dL — ABNORMAL HIGH (ref 100–199)
HDL: 71 mg/dL (ref >39)
LDL Calculated: 145 mg/dL — ABNORMAL HIGH (ref 0–99)
Triglycerides: 161 mg/dL — ABNORMAL HIGH (ref 0–149)
VLDL Cholesterol Cal: 32 mg/dL (ref 5–40)

## 2018-08-16 LAB — VITAMIN B12: Vitamin B-12: 771 pg/mL (ref 232–1245)

## 2018-08-16 LAB — VDRL: Non-Treponemal Screening VDRL: NORMAL

## 2018-08-16 LAB — VITAMIN B1: Thiamine: 154.1 nmol/L (ref 66.5–200.0)

## 2018-08-16 LAB — TSH+FREE T4
Free T4: 1.19 ng/dL (ref 0.82–1.77)
TSH: 2.41 u[IU]/mL (ref 0.450–4.500)

## 2018-08-16 LAB — VITAMIN D 25 HYDROXY (VIT D DEFICIENCY, FRACTURES): Vit D, 25-Hydroxy: 54.1 ng/mL (ref 30.0–100.0)

## 2018-08-16 LAB — FOLATE: Folate: 17.1 ng/mL (ref >3.0)

## 2018-08-21 ENCOUNTER — Telehealth: Payer: Self-pay

## 2018-08-21 NOTE — Telephone Encounter (Signed)
Pt called in wanting to know if she had to continue the meloxicam if her sciatica was better- I explained to her that if it was better, that is a medicine she does not have to continue to take, but hold on to it in case of another flare up

## 2018-08-22 NOTE — Telephone Encounter (Signed)
Reviewed, and noted.

## 2018-08-29 ENCOUNTER — Other Ambulatory Visit: Payer: Self-pay

## 2018-08-29 ENCOUNTER — Ambulatory Visit (INDEPENDENT_AMBULATORY_CARE_PROVIDER_SITE_OTHER): Payer: Medicare HMO

## 2018-08-29 VITALS — BP 132/66 | HR 75 | Temp 98.5°F | Resp 16 | Ht 62.0 in | Wt 161.8 lb

## 2018-08-29 DIAGNOSIS — Z0000 Encounter for general adult medical examination without abnormal findings: Secondary | ICD-10-CM | POA: Diagnosis not present

## 2018-08-29 DIAGNOSIS — M810 Age-related osteoporosis without current pathological fracture: Secondary | ICD-10-CM | POA: Diagnosis not present

## 2018-08-29 NOTE — Patient Instructions (Signed)
Ms. Evelyn Clements , Thank you for taking time to come for your Medicare Wellness Visit. I appreciate your ongoing commitment to your health goals. Please review the following plan we discussed and let me know if I can assist you in the future.   Screening recommendations/referrals: Colonoscopy: done 2012 Mammogram: done 03/30/18 Bone Density: done 10/20/16. Please call [phone] to schedule your bone density screening.  Recommended yearly ophthalmology/optometry visit for glaucoma screening and checkup Recommended yearly dental visit for hygiene and checkup  Vaccinations: Influenza vaccine: done 10/24/17 Pneumococcal vaccine: done 06/06/14 Tdap vaccine: done 2012 Shingles vaccine: Shingrix discussed. Please contact your pharmacy for coverage information.   Conditions/risks identified: recommend drinking 6-8 glasses of water per day  Next appointment: Please follow up in one year for your Medicare Annual Wellness visit.     Preventive Care 50 Years and Older, Female Preventive care refers to lifestyle choices and visits with your health care provider that can promote health and wellness. What does preventive care include?  A yearly physical exam. This is also called an annual well check.  Dental exams once or twice a year.  Routine eye exams. Ask your health care provider how often you should have your eyes checked.  Personal lifestyle choices, including:  Daily care of your teeth and gums.  Regular physical activity.  Eating a healthy diet.  Avoiding tobacco and drug use.  Limiting alcohol use.  Practicing safe sex.  Taking low-dose aspirin every day.  Taking vitamin and mineral supplements as recommended by your health care provider. What happens during an annual well check? The services and screenings done by your health care provider during your annual well check will depend on your age, overall health, lifestyle risk factors, and family history of disease. Counseling   Your health care provider may ask you questions about your:  Alcohol use.  Tobacco use.  Drug use.  Emotional well-being.  Home and relationship well-being.  Sexual activity.  Eating habits.  History of falls.  Memory and ability to understand (cognition).  Work and work Statistician.  Reproductive health. Screening  You may have the following tests or measurements:  Height, weight, and BMI.  Blood pressure.  Lipid and cholesterol levels. These may be checked every 5 years, or more frequently if you are over 38 years old.  Skin check.  Lung cancer screening. You may have this screening every year starting at age 63 if you have a 30-pack-year history of smoking and currently smoke or have quit within the past 15 years.  Fecal occult blood test (FOBT) of the stool. You may have this test every year starting at age 60.  Flexible sigmoidoscopy or colonoscopy. You may have a sigmoidoscopy every 5 years or a colonoscopy every 10 years starting at age 6.  Hepatitis C blood test.  Hepatitis B blood test.  Sexually transmitted disease (STD) testing.  Diabetes screening. This is done by checking your blood sugar (glucose) after you have not eaten for a while (fasting). You may have this done every 1-3 years.  Bone density scan. This is done to screen for osteoporosis. You may have this done starting at age 1.  Mammogram. This may be done every 1-2 years. Talk to your health care provider about how often you should have regular mammograms. Talk with your health care provider about your test results, treatment options, and if necessary, the need for more tests. Vaccines  Your health care provider may recommend certain vaccines, such as:  Influenza vaccine. This  is recommended every year.  Tetanus, diphtheria, and acellular pertussis (Tdap, Td) vaccine. You may need a Td booster every 10 years.  Zoster vaccine. You may need this after age 87.  Pneumococcal 13-valent  conjugate (PCV13) vaccine. One dose is recommended after age 43.  Pneumococcal polysaccharide (PPSV23) vaccine. One dose is recommended after age 80. Talk to your health care provider about which screenings and vaccines you need and how often you need them. This information is not intended to replace advice given to you by your health care provider. Make sure you discuss any questions you have with your health care provider. Document Released: 01/30/2015 Document Revised: 09/23/2015 Document Reviewed: 11/04/2014 Elsevier Interactive Patient Education  2017 [place] Prevention in the Home Falls can cause injuries. They can happen to people of all ages. There are many things you can do to make your home safe and to help prevent falls. What can I do on the outside of my home?  Regularly fix the edges of walkways and driveways and fix any cracks.  Remove anything that might make you trip as you walk through a door, such as a raised step or threshold.  Trim any bushes or trees on the path to your home.  Use bright outdoor lighting.  Clear any walking paths of anything that might make someone trip, such as rocks or tools.  Regularly check to see if handrails are loose or broken. Make sure that both sides of any steps have handrails.  Any raised decks and porches should have guardrails on the edges.  Have any leaves, snow, or ice cleared regularly.  Use sand or salt on walking paths during winter.  Clean up any spills in your garage right away. This includes oil or grease spills. What can I do in the bathroom?  Use night lights.  Install grab bars by the toilet and in the tub and shower. Do not use towel bars as grab bars.  Use non-skid mats or decals in the tub or shower.  If you need to sit down in the shower, use a plastic, non-slip stool.  Keep the floor dry. Clean up any water that spills on the floor as soon as it happens.  Remove soap buildup in the tub or  shower regularly.  Attach bath mats securely with double-sided non-slip rug tape.  Do not have throw rugs and other things on the floor that can make you trip. What can I do in the bedroom?  Use night lights.  Make sure that you have a light by your bed that is easy to reach.  Do not use any sheets or blankets that are too big for your bed. They should not hang down onto the floor.  Have a firm chair that has side arms. You can use this for support while you get dressed.  Do not have throw rugs and other things on the floor that can make you trip. What can I do in the kitchen?  Clean up any spills right away.  Avoid walking on wet floors.  Keep items that you use a lot in easy-to-reach places.  If you need to reach something above you, use a strong step stool that has a grab bar.  Keep electrical cords out of the way.  Do not use floor polish or wax that makes floors slippery. If you must use wax, use non-skid floor wax.  Do not have throw rugs and other things on the floor that can make you  trip. What can I do with my stairs?  Do not leave any items on the stairs.  Make sure that there are handrails on both sides of the stairs and use them. Fix handrails that are broken or loose. Make sure that handrails are as long as the stairways.  Check any carpeting to make sure that it is firmly attached to the stairs. Fix any carpet that is loose or worn.  Avoid having throw rugs at the top or bottom of the stairs. If you do have throw rugs, attach them to the floor with carpet tape.  Make sure that you have a light switch at the top of the stairs and the bottom of the stairs. If you do not have them, ask someone to add them for you. What else can I do to help prevent falls?  Wear shoes that:  Do not have high heels.  Have rubber bottoms.  Are comfortable and fit you well.  Are closed at the toe. Do not wear sandals.  If you use a stepladder:  Make sure that it is fully  opened. Do not climb a closed stepladder.  Make sure that both sides of the stepladder are locked into place.  Ask someone to hold it for you, if possible.  Clearly mark and make sure that you can see:  Any grab bars or handrails.  First and last steps.  Where the edge of each step is.  Use tools that help you move around (mobility aids) if they are needed. These include:  Canes.  Walkers.  Scooters.  Crutches.  Turn on the lights when you go into a dark area. Replace any light bulbs as soon as they burn out.  Set up your furniture so you have a clear path. Avoid moving your furniture around.  If any of your floors are uneven, fix them.  If there are any pets around you, be aware of where they are.  Review your medicines with your doctor. Some medicines can make you feel dizzy. This can increase your chance of falling. Ask your doctor what other things that you can do to help prevent falls. This information is not intended to replace advice given to you by your health care provider. Make sure you discuss any questions you have with your health care provider. Document Released: 10/30/2008 Document Revised: 06/11/2015 Document Reviewed: 02/07/2014 Elsevier Interactive Patient Education  2017 [employer].

## 2018-08-29 NOTE — Progress Notes (Signed)
Subjective:   Evelyn Clements is a 83 y.o. female who presents for Medicare Annual (Subsequent) preventive examination.  Review of Systems:   Cardiac Risk Factors include: advanced age (>49men, >39 women);hypertension     Objective:     Vitals: BP 132/66 (BP Location: Left Arm, Patient Position: Sitting, Cuff Size: Normal)   Pulse 75   Temp 98.5 F (36.9 C) (Oral)   Resp 16   Ht 5\' 2"  (1.575 m)   Wt 161 lb 12.8 oz (73.4 kg)   SpO2 98%   BMI 29.59 kg/m   Body mass index is 29.59 kg/m.  Advanced Directives 08/29/2018 11/17/2016 12/14/2015 10/09/2015 02/03/2015 08/29/2014  Does Patient Have a Medical Advance Directive? Yes Yes No Yes Yes Yes  Type of Paramedic of [place];Living will [place];Living will - - Living will;Healthcare Power of [place] in Chart? Yes - validated most recent copy scanned in chart (See row information) No - copy requested - - - -  Would patient like information on creating a medical advance directive? - - No - Patient declined - - No - patient declined information    Tobacco Social History   Tobacco Use  Smoking Status Never Smoker  Smokeless Tobacco Never Used     Counseling given: Not Answered   Clinical Intake:  Pre-visit preparation completed: Yes  Pain : No/denies pain     BMI - recorded: 29.59 Nutritional Status: BMI 25 -29 Overweight Nutritional Risks: None  How often do you need to have someone help you when you read instructions, pamphlets, or other written materials from your doctor or pharmacy?: 1 - Never  Interpreter Needed?: No  Information entered by :: [name] LPN  Past Medical History:  Diagnosis Date  . Breast cancer ([place]) 1980   right breast ca  . GERD (gastroesophageal reflux disease)   . Hypercholesteremia   . Hypertension   . Mild cognitive impairment    seen by neurology   Past Surgical History:   Procedure Laterality Date  . ABDOMINAL HYSTERECTOMY  1990   Total  . APPENDECTOMY    . BREAST EXCISIONAL BIOPSY Right 1980's   lumpectomy lobular ca pt stated no chemo no rad  . BUNIONECTOMY Right   . CATARACT EXTRACTION W/ INTRAOCULAR LENS  IMPLANT, BILATERAL    . ESOPHAGOGASTRODUODENOSCOPY  2013   Barrett's esophagus  . ESOPHAGOGASTRODUODENOSCOPY (EGD) WITH PROPOFOL N/A 12/14/2015   Procedure: ESOPHAGOGASTRODUODENOSCOPY (EGD) WITH PROPOFOL;  Surgeon: Lucilla Lame, MD;  Location: [facility];  Service: Endoscopy;  Laterality: N/A;  . TONSILLECTOMY     Family History  Problem Relation Age of Onset  . Leukemia Father   . Uterine cancer Mother   . Breast cancer Neg Hx    Social History   Socioeconomic History  . Marital status: Widowed    Spouse name: Not on file  . Number of children: 5  . Years of education: Not on file  . Highest education level: Not on file  Occupational History  . Occupation: retired    Comment: Development worker, community  . Financial resource strain: Not hard at all  . Food insecurity    Worry: Never true    Inability: Never true  . Transportation needs    Medical: No    Non-medical: No  Tobacco Use  . Smoking status: Never Smoker  . Smokeless tobacco: Never Used  Substance and Sexual Activity  .  Alcohol use: No    Alcohol/week: 0.0 standard drinks  . Drug use: No  . Sexual activity: Never  Lifestyle  . Physical activity    Days per week: 7 days    Minutes per session: 20 min  . Stress: Not at all  Relationships  . Social connections    Talks on phone: More than three times a week    Gets together: Twice a week    Attends religious service: More than 4 times per year    Active member of club or organization: No    Attends meetings of clubs or organizations: Never    Relationship status: Widowed  Other Topics Concern  . Not on file  Social History Narrative   Pt moved here from [place], daughter lives in [place]. Pt  currently lives in a retirement community.     Outpatient Encounter Medications as of 08/29/2018  Medication Sig  . Bacillus Coagulans-Inulin (PROBIOTIC FORMULA PO) Take 1 capsule by mouth daily.  Marland Kitchen Bioflavonoid Products (VITAMIN C) CHEW Chew 1 tablet by mouth daily.  . Calcium Carb-Cholecalciferol (CALCIUM + D3) 600-200 MG-UNIT TABS Take 1 tablet by mouth daily.  . hydrochlorothiazide (HYDRODIURIL) 12.5 MG tablet TAKE 1 TABLET DAILY  . omeprazole (PRILOSEC) 40 MG capsule Take 1 capsule (40 mg total) by mouth daily.  . sertraline (ZOLOFT) 25 MG tablet Take 50 mg by mouth at bedtime.  . gabapentin (NEURONTIN) 100 MG capsule Take 100 mg by mouth 2 (two) times daily.  Marland Kitchen losartan (COZAAR) 100 MG tablet Take 1 tablet (100 mg total) by mouth daily. (Patient not taking: Reported on 08/29/2018)  . tizanidine (ZANAFLEX) 2 MG capsule Take 2 mg by mouth daily as needed for muscle spasms.  . [DISCONTINUED] meloxicam (MOBIC) 7.5 MG tablet Take 7.5 mg by mouth daily.   No facility-administered encounter medications on file as of 08/29/2018.     Activities of Daily Living In your present state of health, do you have any difficulty performing the following activities: 08/29/2018 12/18/2017  Hearing? N N  Comment declines hearing aids -  Vision? N N  Difficulty concentrating or making decisions? Y N  Walking or climbing stairs? N N  Dressing or bathing? N N  Doing errands, shopping? N N  Preparing Food and eating ? N -  Using the Toilet? N -  In the past six months, have you accidently leaked urine? N -  Do you have problems with loss of bowel control? N -  Managing your Medications? N -  Managing your Finances? N -  Housekeeping or managing your Housekeeping? N -  Some recent data might be hidden    Patient Care Team: Glean Hess, MD as PCP - General (Internal Medicine) Lucilla Lame, MD as Consulting Physician (Gastroenterology)    Assessment:   This is a routine wellness examination for  [place].  Exercise Activities and Dietary recommendations Current Exercise Habits: Home exercise routine, Type of exercise: calisthenics, Time (Minutes): 20, Frequency (Times/Week): 7, Weekly Exercise (Minutes/Week): 140, Intensity: Moderate, Exercise limited by: None identified  Goals    . Have 3 meals a day     Continue to monitor portion sizes and eat at least 3-5 servings of fruits and vegetables per day    . Weight (lb) < 200 lb (90.7 kg)     Wants to lose stomach        Fall Risk Fall Risk  08/29/2018 03/16/2018 09/04/2017 11/17/2016 10/18/2016  Falls in the past year? 0  0 No Yes No  Number falls in past yr: 0 0 - 1 -  Injury with Fall? 0 0 - No -  Risk for fall due to : - - - Impaired vision -  Risk for fall due to: Comment - - - Requires updated eye exam. States occasional visual defects -  Follow up - Falls evaluation completed - Education provided;Falls prevention discussed -   FALL RISK PREVENTION PERTAINING TO THE HOME:  Any stairs in or around the home? Yes  If so, do they handrails? Yes   Home free of loose throw rugs in walkways, pet beds, electrical cords, etc? Yes  Adequate lighting in your home to reduce risk of falls? Yes   ASSISTIVE DEVICES UTILIZED TO PREVENT FALLS:  Life alert? No  Use of a cane, walker or w/c? No  Grab bars in the bathroom? Yes  Shower chair or bench in shower? Yes  Elevated toilet seat or a handicapped toilet? No   DME ORDERS:  DME order needed?  No   TIMED UP AND GO:  Was the test performed? Yes .  Length of time to ambulate 10 feet: 5 sec.   GAIT:  Appearance of gait: Gait stead-fast and without the use of an assistive device.   Education: Fall risk prevention has been discussed.  Intervention(s) required? No    Depression Screen PHQ 2/9 Scores 08/29/2018 08/13/2018 03/16/2018 09/04/2017  PHQ - 2 Score 6 6 1 1   PHQ- 9 Score 8 11 - -     Cognitive Function MMSE - Mini Mental State Exam 06/05/2017  Orientation to time 5   Orientation to Place 5  Registration 3  Attention/ Calculation 5  Recall 2  Language- name 2 objects 2  Language- repeat 1  Language- follow 3 step command 3  Language- read & follow direction 1  Write a sentence 1  Copy design 1  Total score 29     6CIT Screen 11/17/2016 10/09/2015  What Year? 0 points 0 points  What month? 0 points 0 points  What time? 0 points 0 points  Count back from 20 0 points 0 points  Months in reverse 0 points 0 points  Repeat phrase 0 points 0 points  Total Score 0 0    Immunization History  Administered Date(s) Administered  . Influenza, High Dose Seasonal PF 09/21/2016, 10/24/2017  . Influenza,inj,Quad PF,6+ Mos 10/20/2014, 10/09/2015  . Influenza-Unspecified 10/17/2013  . Pneumococcal Conjugate-13 06/06/2014  . Pneumococcal Polysaccharide-23 01/19/2012  . Tdap 01/18/2010  . Zoster 01/19/2012    Qualifies for Shingles Vaccine? Yes  Zostavax completed 2014. Due for Shingrix. Education has been provided regarding the importance of this vaccine. Pt has been advised to call insurance company to determine out of pocket expense. Advised may also receive vaccine at local pharmacy or Health Dept. Verbalized acceptance and understanding.  Tdap: Up to date  Flu Vaccine: Up to date  Pneumococcal Vaccine: Up to date    Screening Tests Health Maintenance  Topic Date Due  . INFLUENZA VACCINE  08/18/2018  . MAMMOGRAM  03/30/2019  . TETANUS/TDAP  01/19/2020  . DEXA SCAN  Completed  . PNA vac Low Risk Adult  Completed    Cancer Screenings:  Colorectal Screening: Completed 2012.  No longer required.   Mammogram: Completed 03/30/18. Repeat every year  Bone Density: Completed 10/20/16. Results reflect OSTEOPOROSIS. Repeat every 2 years.   Lung Cancer Screening: (Low Dose CT Chest recommended if Age 72-80 years, 30 pack-year currently smoking  OR have quit w/in 15years.) does not qualify.   Additional Screening:  Hepatitis C Screening: no longer  required  Vision Screening: Recommended annual ophthalmology exams for early detection of glaucoma and other disorders of the eye. Is the patient up to date with their annual eye exam?  No  Who is the provider or what is the name of the office in which the pt attends annual eye exams? Not established If pt is not established with a provider, would they like to be referred to a provider to establish care? No . Ophthalmology referral has been placed. Pt aware the office will call re: appt.  Dental Screening: Recommended annual dental exams for proper oral hygiene  Community Resource Referral:  CRR required this visit?  No      Plan:     I have personally reviewed and addressed the Medicare Annual Wellness questionnaire and have noted the following in the patient's chart:  A. Medical and social history B. Use of alcohol, tobacco or illicit drugs  C. Current medications and supplements D. Functional ability and status E.  Nutritional status F.  Physical activity G. Advance directives H. List of other physicians I.  Hospitalizations, surgeries, and ER visits in previous 12 months J.  [place] such as hearing and vision if needed, cognitive and depression L. Referrals and appointments   In addition, I have reviewed and discussed with patient certain preventive protocols, quality metrics, and best practice recommendations. A written personalized care plan for preventive services as well as general preventive health recommendations were provided to patient.   Signed,  [name], LPN Nurse Health Advisor   Nurse Notes: pt scheduled for CPE next week with Dr. Army Melia

## 2018-09-04 LAB — HGB A1C W/O EAG: Hgb A1c MFr Bld: 5.6 % (ref 4.8–5.6)

## 2018-09-04 LAB — SPECIMEN STATUS REPORT

## 2018-09-05 ENCOUNTER — Encounter: Payer: Medicare HMO | Admitting: Internal Medicine

## 2018-09-21 ENCOUNTER — Encounter: Payer: Self-pay | Admitting: Internal Medicine

## 2018-09-21 DIAGNOSIS — G301 Alzheimer's disease with late onset: Secondary | ICD-10-CM | POA: Insufficient documentation

## 2018-09-21 DIAGNOSIS — G3184 Mild cognitive impairment, so stated: Secondary | ICD-10-CM | POA: Insufficient documentation

## 2018-09-21 DIAGNOSIS — F02B3 Dementia in other diseases classified elsewhere, moderate, with mood disturbance: Secondary | ICD-10-CM | POA: Insufficient documentation

## 2018-09-21 DIAGNOSIS — R718 Other abnormality of red blood cells: Secondary | ICD-10-CM | POA: Insufficient documentation

## 2018-10-23 ENCOUNTER — Ambulatory Visit
Admission: RE | Admit: 2018-10-23 | Discharge: 2018-10-23 | Disposition: A | Discharge status: Home / Self Care | Payer: Medicare HMO | Source: Ambulatory Visit | Attending: Internal Medicine | Admitting: Internal Medicine

## 2018-10-23 DIAGNOSIS — Z780 Asymptomatic menopausal state: Secondary | ICD-10-CM | POA: Diagnosis not present

## 2018-10-23 DIAGNOSIS — M810 Age-related osteoporosis without current pathological fracture: Secondary | ICD-10-CM | POA: Insufficient documentation

## 2018-11-03 DIAGNOSIS — R69 Illness, unspecified: Secondary | ICD-10-CM | POA: Diagnosis not present

## 2018-11-09 ENCOUNTER — Other Ambulatory Visit: Payer: Self-pay

## 2018-11-09 DIAGNOSIS — Z20822 Contact with and (suspected) exposure to covid-19: Secondary | ICD-10-CM

## 2018-12-02 ENCOUNTER — Other Ambulatory Visit: Payer: Self-pay | Admitting: Internal Medicine

## 2018-12-02 DIAGNOSIS — I10 Essential (primary) hypertension: Secondary | ICD-10-CM

## 2018-12-10 DIAGNOSIS — M810 Age-related osteoporosis without current pathological fracture: Secondary | ICD-10-CM | POA: Diagnosis not present

## 2018-12-24 ENCOUNTER — Ambulatory Visit: Payer: Medicare HMO | Admitting: Internal Medicine

## 2019-01-08 DIAGNOSIS — R69 Illness, unspecified: Secondary | ICD-10-CM | POA: Diagnosis not present

## 2019-01-08 DIAGNOSIS — R252 Cramp and spasm: Secondary | ICD-10-CM | POA: Diagnosis not present

## 2019-01-16 ENCOUNTER — Other Ambulatory Visit: Payer: Self-pay

## 2019-01-16 ENCOUNTER — Encounter: Payer: Self-pay | Admitting: Internal Medicine

## 2019-01-16 ENCOUNTER — Ambulatory Visit (INDEPENDENT_AMBULATORY_CARE_PROVIDER_SITE_OTHER): Payer: Medicare HMO | Admitting: Internal Medicine

## 2019-01-16 VITALS — BP 132/68 | HR 80 | Resp 16 | Ht 62.0 in | Wt 170.0 lb

## 2019-01-16 DIAGNOSIS — K2270 Barrett's esophagus without dysplasia: Secondary | ICD-10-CM | POA: Diagnosis not present

## 2019-01-16 DIAGNOSIS — R011 Cardiac murmur, unspecified: Secondary | ICD-10-CM | POA: Diagnosis not present

## 2019-01-16 DIAGNOSIS — Z0000 Encounter for general adult medical examination without abnormal findings: Secondary | ICD-10-CM

## 2019-01-16 DIAGNOSIS — I10 Essential (primary) hypertension: Secondary | ICD-10-CM | POA: Diagnosis not present

## 2019-01-16 DIAGNOSIS — D509 Iron deficiency anemia, unspecified: Secondary | ICD-10-CM

## 2019-01-16 DIAGNOSIS — G3184 Mild cognitive impairment, so stated: Secondary | ICD-10-CM

## 2019-01-16 DIAGNOSIS — D649 Anemia, unspecified: Secondary | ICD-10-CM | POA: Diagnosis not present

## 2019-01-16 DIAGNOSIS — Z1231 Encounter for screening mammogram for malignant neoplasm of breast: Secondary | ICD-10-CM | POA: Diagnosis not present

## 2019-01-16 LAB — POCT URINALYSIS DIPSTICK
Bilirubin, UA: NEGATIVE
Blood, UA: NEGATIVE
Glucose, UA: NEGATIVE
Ketones, UA: NEGATIVE
Leukocytes, UA: NEGATIVE
Nitrite, UA: NEGATIVE
Protein, UA: NEGATIVE
Spec Grav, UA: 1.015 (ref 1.010–1.025)
Urobilinogen, UA: 0.2 E.U./dL
pH, UA: 6.5 (ref 5.0–8.0)

## 2019-01-16 NOTE — Patient Instructions (Addendum)
B-complex vitamin daily may help with fatigue and decreased energy.  Magnesium supplement daily may help with leg cramps.  Call to schedule your Mammogram for March.
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
## 2019-01-17 LAB — CBC WITH DIFFERENTIAL/PLATELET
Basophils Absolute: 0.2 10*3/uL (ref 0.0–0.2)
Basos: 1 %
EOS (ABSOLUTE): 0.3 10*3/uL (ref 0.0–0.4)
Eos: 2 %
Hematocrit: 23 % — ABNORMAL LOW (ref 34.0–46.6)
Hemoglobin: 6.4 g/dL — CL (ref 11.1–15.9)
Immature Grans (Abs): 0.1 10*3/uL (ref 0.0–0.1)
Immature Granulocytes: 1 %
Lymphocytes Absolute: 2.1 10*3/uL (ref 0.7–3.1)
Lymphs: 16 %
MCH: 19.5 pg — ABNORMAL LOW (ref 26.6–33.0)
MCHC: 27.8 g/dL — ABNORMAL LOW (ref 31.5–35.7)
MCV: 70 fL — ABNORMAL LOW (ref 79–97)
Monocytes Absolute: 1.5 10*3/uL — ABNORMAL HIGH (ref 0.1–0.9)
Monocytes: 12 %
Neutrophils Absolute: 8.7 10*3/uL — ABNORMAL HIGH (ref 1.4–7.0)
Neutrophils: 68 %
Platelets: 422 10*3/uL (ref 150–450)
RBC: 3.29 x10E6/uL — ABNORMAL LOW (ref 3.77–5.28)
RDW: 15.8 % — ABNORMAL HIGH (ref 11.7–15.4)
WBC: 12.7 10*3/uL — ABNORMAL HIGH (ref 3.4–10.8)

## 2019-01-17 LAB — COMPREHENSIVE METABOLIC PANEL
ALT: 14 IU/L (ref 0–32)
AST: 21 IU/L (ref 0–40)
Albumin/Globulin Ratio: 1.9 (ref 1.2–2.2)
Albumin: 4 g/dL (ref 3.6–4.6)
Alkaline Phosphatase: 84 IU/L (ref 39–117)
BUN/Creatinine Ratio: 21 (ref 12–28)
BUN: 21 mg/dL (ref 8–27)
Bilirubin Total: 0.2 mg/dL (ref 0.0–1.2)
CO2: 23 mmol/L (ref 20–29)
Calcium: 9.5 mg/dL (ref 8.7–10.3)
Chloride: 105 mmol/L (ref 96–106)
Creatinine, Ser: 0.99 mg/dL (ref 0.57–1.00)
GFR calc Af Amer: 62 mL/min/{1.73_m2} (ref >59)
GFR calc non Af Amer: 54 mL/min/{1.73_m2} — ABNORMAL LOW (ref >59)
Globulin, Total: 2.1 g/dL (ref 1.5–4.5)
Glucose: 76 mg/dL (ref 65–99)
Potassium: 4.1 mmol/L (ref 3.5–5.2)
Sodium: 142 mmol/L (ref 134–144)
Total Protein: 6.1 g/dL (ref 6.0–8.5)

## 2019-01-17 LAB — LIPID PANEL
Chol/HDL Ratio: 3.3 ratio (ref 0.0–4.4)
Cholesterol, Total: 219 mg/dL — ABNORMAL HIGH (ref 100–199)
HDL: 66 mg/dL (ref >39)
LDL Chol Calc (NIH): 129 mg/dL — ABNORMAL HIGH (ref 0–99)
Triglycerides: 135 mg/dL (ref 0–149)
VLDL Cholesterol Cal: 24 mg/dL (ref 5–40)

## 2019-01-17 LAB — TSH: TSH: 2.7 u[IU]/mL (ref 0.450–4.500)

## 2019-01-21 ENCOUNTER — Other Ambulatory Visit: Payer: Self-pay | Admitting: Internal Medicine

## 2019-01-22 ENCOUNTER — Ambulatory Visit
Admission: RE | Admit: 2019-01-22 | Discharge: 2019-01-22 | Disposition: A | Discharge status: Home / Self Care | Payer: Medicare PPO | Source: Ambulatory Visit | Attending: Internal Medicine | Admitting: Internal Medicine

## 2019-01-22 ENCOUNTER — Other Ambulatory Visit: Payer: Self-pay

## 2019-01-22 DIAGNOSIS — I10 Essential (primary) hypertension: Secondary | ICD-10-CM | POA: Diagnosis not present

## 2019-01-22 DIAGNOSIS — R011 Cardiac murmur, unspecified: Secondary | ICD-10-CM

## 2019-01-22 DIAGNOSIS — I071 Rheumatic tricuspid insufficiency: Secondary | ICD-10-CM | POA: Insufficient documentation

## 2019-01-22 DIAGNOSIS — I313 Pericardial effusion (noninflammatory): Secondary | ICD-10-CM | POA: Insufficient documentation

## 2019-01-22 LAB — FERRITIN: Ferritin: 6 ng/mL — ABNORMAL LOW (ref 15–150)

## 2019-01-22 LAB — IRON AND TIBC
Iron Saturation: 2 % — CL (ref 15–55)
Iron: 11 ug/dL — ABNORMAL LOW (ref 27–139)
Total Iron Binding Capacity: 444 ug/dL (ref 250–450)
UIBC: 433 ug/dL — ABNORMAL HIGH (ref 118–369)

## 2019-01-22 LAB — SPECIMEN STATUS REPORT

## 2019-01-22 NOTE — Progress Notes (Signed)
*  PRELIMINARY RESULTS* Echocardiogram 2D Echocardiogram has been performed.  Sherrie Sport 01/22/2019, 11:41 AM

## 2019-01-24 ENCOUNTER — Telehealth: Payer: Self-pay | Admitting: Internal Medicine

## 2019-01-24 NOTE — Telephone Encounter (Signed)
Calling about test results

## 2019-01-24 NOTE — Addendum Note (Signed)
Addended by: Glean Hess on: 01/24/2019 12:03 PM   Modules accepted: Orders

## 2019-01-25 ENCOUNTER — Ambulatory Visit (HOSPITAL_COMMUNITY): Payer: Medicare PPO

## 2019-01-25 ENCOUNTER — Encounter (HOSPITAL_COMMUNITY): Payer: Self-pay

## 2019-01-28 NOTE — Telephone Encounter (Signed)
Spoke with pt about ECHO results. Thank you.

## 2019-02-05 ENCOUNTER — Encounter (HOSPITAL_COMMUNITY): Payer: Self-pay

## 2019-02-05 ENCOUNTER — Ambulatory Visit (HOSPITAL_COMMUNITY): Admission: RE | Admit: 2019-02-05 | Payer: Medicare PPO | Source: Ambulatory Visit

## 2019-02-21 ENCOUNTER — Other Ambulatory Visit: Payer: Self-pay

## 2019-02-21 ENCOUNTER — Ambulatory Visit (HOSPITAL_COMMUNITY)
Admission: RE | Admit: 2019-02-21 | Discharge: 2019-02-21 | Disposition: A | Discharge status: Home / Self Care | Payer: Medicare PPO | Source: Ambulatory Visit | Attending: Neurology | Admitting: Neurology

## 2019-02-21 DIAGNOSIS — R413 Other amnesia: Secondary | ICD-10-CM | POA: Insufficient documentation

## 2019-02-21 DIAGNOSIS — R930 Abnormal findings on diagnostic imaging of skull and head, not elsewhere classified: Secondary | ICD-10-CM | POA: Diagnosis not present

## 2019-02-28 DIAGNOSIS — M810 Age-related osteoporosis without current pathological fracture: Secondary | ICD-10-CM | POA: Diagnosis not present

## 2019-03-18 ENCOUNTER — Other Ambulatory Visit: Payer: Self-pay

## 2019-03-18 ENCOUNTER — Encounter: Payer: Self-pay | Admitting: Internal Medicine

## 2019-03-18 ENCOUNTER — Ambulatory Visit (INDEPENDENT_AMBULATORY_CARE_PROVIDER_SITE_OTHER): Payer: Medicare HMO | Admitting: Internal Medicine

## 2019-03-18 VITALS — BP 124/70 | HR 74 | Temp 97.7°F | Ht 62.0 in | Wt 169.0 lb

## 2019-03-18 DIAGNOSIS — D509 Iron deficiency anemia, unspecified: Secondary | ICD-10-CM

## 2019-03-18 DIAGNOSIS — K2270 Barrett's esophagus without dysplasia: Secondary | ICD-10-CM | POA: Diagnosis not present

## 2019-03-18 DIAGNOSIS — G3184 Mild cognitive impairment, so stated: Secondary | ICD-10-CM

## 2019-03-18 NOTE — Patient Instructions (Signed)
Start taking Omeprazole every day.  Also take Iron supplement every day.  Keep appt with Dr. Allen Norris - GI specialist.
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
## 2019-03-19 LAB — IRON,TIBC AND FERRITIN PANEL
Ferritin: 78 ng/mL (ref 15–150)
Iron Saturation: 14 % — ABNORMAL LOW (ref 15–55)
Iron: 49 ug/dL (ref 27–139)
Total Iron Binding Capacity: 354 ug/dL (ref 250–450)
UIBC: 305 ug/dL (ref 118–369)

## 2019-03-19 LAB — CBC WITH DIFFERENTIAL/PLATELET
Basophils Absolute: 0.1 10*3/uL (ref 0.0–0.2)
Basos: 1 %
EOS (ABSOLUTE): 0.3 10*3/uL (ref 0.0–0.4)
Eos: 3 %
Hematocrit: 38 % (ref 34.0–46.6)
Hemoglobin: 11.7 g/dL (ref 11.1–15.9)
Immature Grans (Abs): 0.1 10*3/uL (ref 0.0–0.1)
Immature Granulocytes: 1 %
Lymphocytes Absolute: 1.7 10*3/uL (ref 0.7–3.1)
Lymphs: 18 %
MCH: 25.2 pg — ABNORMAL LOW (ref 26.6–33.0)
MCHC: 30.8 g/dL — ABNORMAL LOW (ref 31.5–35.7)
MCV: 82 fL (ref 79–97)
Monocytes Absolute: 0.9 10*3/uL (ref 0.1–0.9)
Monocytes: 9 %
Neutrophils Absolute: 6.8 10*3/uL (ref 1.4–7.0)
Neutrophils: 68 %
Platelets: 254 10*3/uL (ref 150–450)
RBC: 4.65 x10E6/uL (ref 3.77–5.28)
RDW: 22.5 % — ABNORMAL HIGH (ref 11.7–15.4)
WBC: 9.8 10*3/uL (ref 3.4–10.8)

## 2019-03-28 DIAGNOSIS — R197 Diarrhea, unspecified: Secondary | ICD-10-CM | POA: Diagnosis not present

## 2019-04-02 ENCOUNTER — Ambulatory Visit: Payer: Medicare HMO | Admitting: Gastroenterology

## 2019-04-02 ENCOUNTER — Other Ambulatory Visit: Payer: Self-pay

## 2019-04-02 ENCOUNTER — Encounter: Payer: Self-pay | Admitting: Gastroenterology

## 2019-04-02 VITALS — BP 158/69 | HR 90 | Temp 98.4°F | Ht 62.0 in | Wt 167.4 lb

## 2019-04-02 DIAGNOSIS — R197 Diarrhea, unspecified: Secondary | ICD-10-CM

## 2019-04-02 DIAGNOSIS — R143 Flatulence: Secondary | ICD-10-CM | POA: Diagnosis not present

## 2019-04-02 NOTE — Progress Notes (Signed)
Gastroenterology Consultation  Referring Provider:     Glean Hess, MD Primary Care Physician:  Glean Hess, MD Primary Gastroenterologist:  Dr. Allen Norris     Reason for Consultation:     Diarrhea        HPI:   Evelyn Clements is a 84 y.o. y/o female referred for consultation & management of diarrhea by Dr. Army Melia, Jesse Sans, MD.  This patient comes to see me after having an upper endoscopy back in 2017 for follow-up of Barrett's esophagus.  The patient was seen by her primary care provider and again in urgent care for diarrhea.  The patient states that she lives at a retirement facility and she ate something that did not agree with her.  She is not sure what it is but she does state that shortly after eating it she realized that it was a mistake and she started to have diarrhea.  The patient does not recall being in urgent care although there are notes from her being seen in urgent care and she also states that she believes she had seen me within the last few months which is also not the case.  The patient now reports that her diarrhea has completely resolved and she is having no abdominal pain fevers chills nausea or vomiting.  She is having some problems with flatulence.  She now reports that she is coming today to follow-up since she had the appointment and did not want to miss it.  Past Medical History:  Diagnosis Date  . Breast cancer ([place]) 1980   right breast ca  . GERD (gastroesophageal reflux disease)   . Hypercholesteremia   . Hypertension   . Mild cognitive impairment    seen by neurology    Past Surgical History:  Procedure Laterality Date  . ABDOMINAL HYSTERECTOMY  1990   Total  . APPENDECTOMY    . BREAST EXCISIONAL BIOPSY Right 1980's   lumpectomy lobular ca pt stated no chemo no rad  . BUNIONECTOMY Right   . CATARACT EXTRACTION W/ INTRAOCULAR LENS  IMPLANT, BILATERAL    . ESOPHAGOGASTRODUODENOSCOPY  2013   Barrett's esophagus  . ESOPHAGOGASTRODUODENOSCOPY (EGD)  WITH PROPOFOL N/A 12/14/2015   Procedure: ESOPHAGOGASTRODUODENOSCOPY (EGD) WITH PROPOFOL;  Surgeon: Lucilla Lame, MD;  Location: [place];  Service: Endoscopy;  Laterality: N/A;  . TONSILLECTOMY      Prior to Admission medications   Medication Sig Start Date End Date Taking? Authorizing Provider  Bacillus Coagulans-Inulin (PROBIOTIC FORMULA PO) Take 1 capsule by mouth daily.   Yes [provider]  Bioflavonoid Products (VITAMIN C) CHEW Chew 1 tablet by mouth daily.   Yes [provider]  Calcium Carb-Cholecalciferol (CALCIUM + D3) 600-200 MG-UNIT TABS Take 1 tablet by mouth daily.   Yes [provider]  diltiazem (TIAZAC) 120 MG 24 hr capsule Take by mouth. 01/30/19 01/30/20 Yes [provider]  donepezil (ARICEPT) 5 MG tablet Take 5 mg by mouth daily. 01/08/19  Yes [provider]  ferrous sulfate 324 MG TBEC Take 324 mg by mouth in the morning and at bedtime.   Yes [provider]  hydrochlorothiazide (HYDRODIURIL) 12.5 MG tablet TAKE 1 TABLET DAILY 07/16/18  Yes Glean Hess, MD  sertraline (ZOLOFT) 50 MG tablet TAKE 1/2 TAB AT BEDTIME FOR 2 WEEKS, THEN 1 TAB AT BEDTIME 11/18/18  Yes [provider]  losartan (COZAAR) 100 MG tablet TAKE 1 TABLET BY MOUTH EVERY DAY Patient not taking: Reported on 04/02/2019 12/02/18  Glean Hess, MD    Family History  Problem Relation Age of Onset  . Leukemia Father   . Uterine cancer Mother   . Breast cancer Neg Hx      Social History   Tobacco Use  . Smoking status: Never Smoker  . Smokeless tobacco: Never Used  Substance Use Topics  . Alcohol use: No    Alcohol/week: 0.0 standard drinks  . Drug use: No    Allergies as of 04/02/2019 - Review Complete 04/02/2019  Allergen Reaction Noted  . Zolpidem tartrate  06/21/2014    Review of Systems:    All systems reviewed and negative except where noted in HPI.   Physical Exam:  BP (!) 158/69   Pulse 90   Temp  98.4 F (36.9 C) (Oral)   Ht 5\' 2"  (1.575 m)   Wt 167 lb 6.4 oz (75.9 kg)   BMI 30.62 kg/m  No LMP recorded. Patient has had a hysterectomy. General:   Alert,  Well-developed, well-nourished, pleasant and cooperative in NAD Head:  Normocephalic and atraumatic. Eyes:  Sclera clear, no icterus.   Conjunctiva pink. Ears:  Normal auditory acuity. Neck:  Supple; no masses or thyromegaly. Lungs:  Respirations even and unlabored.  Clear throughout to auscultation.   No wheezes, crackles, or rhonchi. No acute distress. Heart:  Regular rate and rhythm; no murmurs, clicks, rubs, or gallops. Abdomen:  Normal bowel sounds.  No bruits.  Soft, non-tender and non-distended without masses, hepatosplenomegaly or hernias noted.  No guarding or rebound tenderness.  Negative Carnett sign.   Rectal:  Deferred.  Pulses:  Normal pulses noted. Extremities:  No clubbing or edema.  No cyanosis. Neurologic:  Alert and oriented x3;  grossly normal neurologically. Skin:  Intact without significant lesions or rashes.  No jaundice. Lymph Nodes:  No significant cervical adenopathy. Psych:  Alert and cooperative. Normal mood and affect.  Imaging Studies: No results found.  Assessment and Plan:   Evelyn Clements is a 84 y.o. y/o female who comes in after having a few days of diarrhea.  The patient is somewhat confused and although she can tell me that she used to live in [facility] and that she now moved here to be closer to her daughter she does not recall going to urgent care 5 days ago and she also believes that she had seen me within the last 2 months which is also not the case.  Despite that the patient is not having any further issues with diarrhea at this time and is here just to follow-up in case the diarrhea comes back.  The patient has been given my card and has been told to call me if the diarrhea comes back.  The patient has also been told to try and avoid foods that make her gassy.  She states she  takes Lactaid when she drinks milk and I have told her that she may need to increase the dose because she may not be taking enough bleeding to her excessive flatulence.  The patient has been explained the plan agrees with it.    Lucilla Lame, MD. [facility]    Note: This dictation was prepared with Dragon dictation along with smaller phrase technology. Any transcriptional errors that result from this process are unintentional.

## 2019-04-25 DIAGNOSIS — I10 Essential (primary) hypertension: Secondary | ICD-10-CM | POA: Diagnosis not present

## 2019-05-09 ENCOUNTER — Encounter: Payer: Self-pay | Admitting: Internal Medicine

## 2019-05-09 ENCOUNTER — Ambulatory Visit (INDEPENDENT_AMBULATORY_CARE_PROVIDER_SITE_OTHER): Payer: Medicare Other | Admitting: Internal Medicine

## 2019-05-09 ENCOUNTER — Other Ambulatory Visit: Payer: Self-pay

## 2019-05-09 VITALS — BP 122/64 | HR 79 | Temp 97.1°F | Ht 62.0 in | Wt 166.0 lb

## 2019-05-09 DIAGNOSIS — I10 Essential (primary) hypertension: Secondary | ICD-10-CM

## 2019-05-09 DIAGNOSIS — G3184 Mild cognitive impairment, so stated: Secondary | ICD-10-CM | POA: Diagnosis not present

## 2019-05-09 DIAGNOSIS — M810 Age-related osteoporosis without current pathological fracture: Secondary | ICD-10-CM

## 2019-05-09 DIAGNOSIS — R143 Flatulence: Secondary | ICD-10-CM | POA: Diagnosis not present
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
## 2019-05-13 ENCOUNTER — Other Ambulatory Visit: Payer: Self-pay | Admitting: Internal Medicine

## 2019-05-13 DIAGNOSIS — I10 Essential (primary) hypertension: Secondary | ICD-10-CM

## 2019-05-21 ENCOUNTER — Ambulatory Visit
Admission: RE | Admit: 2019-05-21 | Discharge: 2019-05-21 | Disposition: A | Discharge status: Home / Self Care | Payer: Medicare Other | Source: Ambulatory Visit | Attending: Internal Medicine | Admitting: Internal Medicine

## 2019-05-21 ENCOUNTER — Other Ambulatory Visit: Payer: Self-pay

## 2019-05-21 DIAGNOSIS — Z1231 Encounter for screening mammogram for malignant neoplasm of breast: Secondary | ICD-10-CM | POA: Diagnosis not present

## 2019-06-05 DIAGNOSIS — M810 Age-related osteoporosis without current pathological fracture: Secondary | ICD-10-CM | POA: Diagnosis not present

## 2019-06-19 ENCOUNTER — Telehealth: Payer: Self-pay | Admitting: Internal Medicine
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
## 2019-06-20 DIAGNOSIS — E559 Vitamin D deficiency, unspecified: Secondary | ICD-10-CM | POA: Diagnosis not present

## 2019-06-20 DIAGNOSIS — M810 Age-related osteoporosis without current pathological fracture: Secondary | ICD-10-CM | POA: Diagnosis not present

## 2019-06-25 ENCOUNTER — Telehealth: Payer: Self-pay | Admitting: Internal Medicine

## 2019-06-25 NOTE — Telephone Encounter (Signed)
Copied from [place] [id number]. Topic: General - Other >> Jun 25, 2019  3:53 PM Erick Blinks wrote: Pt's daughter called to notify the front office that she is faxing paperwork over regarding pt's DMV license reinstatement.  Best contact: [phone]

## 2019-08-28 ENCOUNTER — Telehealth: Payer: Self-pay | Admitting: Internal Medicine

## 2019-08-28 NOTE — Telephone Encounter (Signed)
LM for pt to call back and reschedule AWV due to [facility] being out of office

## 2019-09-02 ENCOUNTER — Ambulatory Visit: Payer: Medicare HMO

## 2019-09-02 ENCOUNTER — Telehealth: Payer: Self-pay | Admitting: Internal Medicine

## 2019-09-02 NOTE — Telephone Encounter (Signed)
Copied from [place] [phone]. Topic: Referral - Request for Referral >> Sep 02, 2019  3:45 PM [name] D wrote: Has patient seen PCP for this complaint? No. *If NO, is insurance requiring patient see PCP for this issue before PCP can refer them? Referral for which specialty: Optometry Preferred provider/office: [facility] Reason for referral: Patient needs an authorization

## 2019-09-04 ENCOUNTER — Ambulatory Visit (INDEPENDENT_AMBULATORY_CARE_PROVIDER_SITE_OTHER): Payer: Medicare Other

## 2019-09-04 ENCOUNTER — Other Ambulatory Visit: Payer: Self-pay

## 2019-09-04 VITALS — BP 120/80 | HR 69 | Resp 16 | Ht 62.0 in | Wt 164.4 lb

## 2019-09-04 DIAGNOSIS — Z0000 Encounter for general adult medical examination without abnormal findings: Secondary | ICD-10-CM

## 2019-09-04 NOTE — Progress Notes (Signed)
Subjective:   Evelyn Clements is a 84 y.o. female who presents for Medicare Annual (Subsequent) preventive examination.  Review of Systems     Cardiac Risk Factors include: advanced age (>63men, >65 women);hypertension;obesity (BMI >30kg/m2)     Objective:    Today's Vitals   09/04/19 1439  BP: 120/80  Pulse: 69  Resp: 16  SpO2: 96%  Weight: 164 lb 6.4 oz (74.6 kg)  Height: 5\' 2"  (1.575 m)   Body mass index is 30.07 kg/m.  Advanced Directives 09/04/2019 08/29/2018 11/17/2016 12/14/2015 10/09/2015 02/03/2015 08/29/2014  Does Patient Have a Medical Advance Directive? Yes Yes Yes No Yes Yes Yes  Type of Paramedic of [place];Living will [place];Living will [place];Living will - - Living will;Healthcare Power of [place] in Chart? Yes - validated most recent copy scanned in chart (See row information) Yes - validated most recent copy scanned in chart (See row information) No - copy requested - - - -  Would patient like information on creating a medical advance directive? - - - No - Patient declined - - No - patient declined information    Current Medications (verified) Outpatient Encounter Medications as of 09/04/2019  Medication Sig  . Bacillus Coagulans-Inulin (PROBIOTIC FORMULA PO) Take 1 capsule by mouth daily.  Marland Kitchen Bioflavonoid Products (VITAMIN C) CHEW Chew 1 tablet by mouth daily.  . Calcium Carb-Cholecalciferol (CALCIUM + D3) 600-200 MG-UNIT TABS Take 1 tablet by mouth daily.  Marland Kitchen diltiazem (CARDIZEM CD) 120 MG 24 hr capsule Take 120 mg by mouth daily.  Marland Kitchen donepezil (ARICEPT) 5 MG tablet Take 5 mg by mouth daily.  . hydrochlorothiazide (HYDRODIURIL) 12.5 MG tablet TAKE 1 TABLET BY MOUTH EVERY DAY  . sertraline (ZOLOFT) 50 MG tablet TAKE 1/2 TAB AT BEDTIME FOR 2 WEEKS, THEN 1 TAB AT BEDTIME  . [DISCONTINUED] diltiazem (TIAZAC) 120 MG 24 hr capsule Take by  mouth.   No facility-administered encounter medications on file as of 09/04/2019.    Allergies (verified) Zolpidem tartrate   History: Past Medical History:  Diagnosis Date  . Breast cancer ([place]) 1980   right breast ca  . GERD (gastroesophageal reflux disease)   . Hypercholesteremia   . Hypertension   . Mild cognitive impairment    seen by neurology   Past Surgical History:  Procedure Laterality Date  . ABDOMINAL HYSTERECTOMY  1990   Total  . APPENDECTOMY    . BREAST EXCISIONAL BIOPSY Right 1980's   lumpectomy lobular ca pt stated no chemo no rad  . BUNIONECTOMY Right   . CATARACT EXTRACTION W/ INTRAOCULAR LENS  IMPLANT, BILATERAL    . ESOPHAGOGASTRODUODENOSCOPY  2013   Barrett's esophagus  . ESOPHAGOGASTRODUODENOSCOPY (EGD) WITH PROPOFOL N/A 12/14/2015   Procedure: ESOPHAGOGASTRODUODENOSCOPY (EGD) WITH PROPOFOL;  Surgeon: Lucilla Lame, MD;  Location: [facility];  Service: Endoscopy;  Laterality: N/A;  . TONSILLECTOMY     Family History  Problem Relation Age of Onset  . Leukemia Father   . Uterine cancer Mother   . Breast cancer Neg Hx    Social History   Socioeconomic History  . Marital status: Widowed    Spouse name: Not on file  . Number of children: 5  . Years of education: Not on file  . Highest education level: Not on file  Occupational History  . Occupation: retired    Comment: Cabin crew  Tobacco Use  . Smoking status: Never  Smoker  . Smokeless tobacco: Never Used  Vaping Use  . Vaping Use: Never used  Substance and Sexual Activity  . Alcohol use: No    Alcohol/week: 0.0 standard drinks  . Drug use: No  . Sexual activity: Never  Other Topics Concern  . Not on file  Social History Narrative   Pt moved here from [place], daughter lives in [place]. Pt currently lives alone in a retirement community.    Social Determinants of Health   Financial Resource Strain: Low Risk   . Difficulty of Paying Living Expenses: Not hard at all  Food  Insecurity: No Food Insecurity  . Worried About Charity fundraiser in the Last Year: Never true  . Ran Out of Food in the Last Year: Never true  Transportation Needs: No Transportation Needs  . Lack of Transportation (Medical): No  . Lack of Transportation (Non-Medical): No  Physical Activity: Inactive  . Days of Exercise per Week: 0 days  . Minutes of Exercise per Session: 0 min  Stress: No Stress Concern Present  . Feeling of Stress : Not at all  Social Connections: Moderately Isolated  . Frequency of Communication with Friends and Family: More than three times a week  . Frequency of Social Gatherings with Friends and Family: Twice a week  . Attends Religious Services: More than 4 times per year  . Active Member of Clubs or Organizations: No  . Attends Archivist Meetings: Never  . Marital Status: Widowed    Tobacco Counseling Counseling given: Not Answered   Clinical Intake:  Pre-visit preparation completed: Yes  Pain : No/denies pain     BMI - recorded: 30.07 Nutritional Status: BMI > 30  Obese Nutritional Risks: None Diabetes: No  How often do you need to have someone help you when you read instructions, pamphlets, or other written materials from your doctor or pharmacy?: 1 - Never    Interpreter Needed?: No  Information entered by :: [name] LPN   Activities of Daily Living In your present state of health, do you have any difficulty performing the following activities: 09/04/2019  Hearing? N  Comment declines hearing aids  Vision? N  Difficulty concentrating or making decisions? N  Walking or climbing stairs? N  Dressing or bathing? N  Doing errands, shopping? N  Preparing Food and eating ? N  Using the Toilet? N  In the past six months, have you accidently leaked urine? N  Do you have problems with loss of bowel control? N  Managing your Medications? N  Managing your Finances? N  Housekeeping or managing your Housekeeping? N  Some  recent data might be hidden    Patient Care Team: Glean Hess, MD as PCP - General (Internal Medicine) Lucilla Lame, MD as Consulting Physician (Gastroenterology) Vladimir Crofts, MD as Consulting Physician (Neurology) Ubaldo Glassing Javier Docker, MD as Consulting Physician (Cardiology)  Indicate any recent Medical Services you may have received from other than [facility] providers in the past year (date may be approximate).     Assessment:   This is a routine wellness examination for [place].  Hearing/Vision screen  Hearing Screening   125Hz  250Hz  500Hz  1000Hz  2000Hz  3000Hz  4000Hz  6000Hz  8000Hz   Right ear:           Left ear:           Comments: Pt denies hearing difficulty  Vision Screening Comments: [facility] screening [facility]  Dietary issues and exercise  activities discussed: Current Exercise Habits: The patient does not participate in regular exercise at present, Exercise limited by: None identified  Goals    . Have 3 meals a day     Continue to monitor portion sizes and eat at least 3-5 servings of fruits and vegetables per day    . Weight (lb) < 200 lb (90.7 kg)     Wants to lose stomach       Depression Screen PHQ 2/9 Scores 09/04/2019 05/09/2019 03/18/2019 01/16/2019 08/29/2018 08/13/2018 03/16/2018  PHQ - 2 Score 2 4 4 2 6 6 1   PHQ- 9 Score 2 4 4 3 8 11  -    Fall Risk Fall Risk  09/04/2019 05/09/2019 03/18/2019 01/16/2019 08/29/2018  Falls in the past year? 0 0 0 0 0  Number falls in past yr: 0 0 0 0 0  Injury with Fall? 0 0 0 0 0  Risk for fall due to : No Fall Risks No Fall Risks - - -  Risk for fall due to: Comment - - - - -  Follow up Follow up appointment Falls evaluation completed Falls evaluation completed - -    Any stairs in or around the home? No  If so, are there any without handrails? No  Home free of loose throw rugs in walkways, pet beds, electrical cords, etc? Yes  Adequate lighting in your home to reduce risk of falls? Yes   ASSISTIVE  DEVICES UTILIZED TO PREVENT FALLS:  Life alert? No  Use of a cane, walker or w/c? No  Grab bars in the bathroom? Yes  Shower chair or bench in shower? Yes Elevated toilet seat or a handicapped toilet? No   TIMED UP AND GO:  Was the test performed? Yes .  Length of time to ambulate 10 feet: 5 sec.   Gait steady and fast without use of assistive device  Cognitive Function: MMSE - Mini Mental State Exam 06/05/2017  Orientation to time 5  Orientation to Place 5  Registration 3  Attention/ Calculation 5  Recall 2  Language- name 2 objects 2  Language- repeat 1  Language- follow 3 step command 3  Language- read & follow direction 1  Write a sentence 1  Copy design 1  Total score 29     6CIT Screen 09/04/2019 11/17/2016 10/09/2015  What Year? 0 points 0 points 0 points  What month? 0 points 0 points 0 points  What time? 0 points 0 points 0 points  Count back from 20 0 points 0 points 0 points  Months in reverse 0 points 0 points 0 points  Repeat phrase 2 points 0 points 0 points  Total Score 2 0 0    Immunizations Immunization History  Administered Date(s) Administered  . Influenza, High Dose Seasonal PF 09/21/2016, 10/24/2017, 11/03/2018  . Influenza,inj,Quad PF,6+ Mos 10/20/2014, 10/09/2015  . Influenza-Unspecified 10/17/2013, 11/03/2018  . PFIZER SARS-COV-2 Vaccination 02/18/2019, 03/11/2019  . Pneumococcal Conjugate-13 06/06/2014  . Pneumococcal Polysaccharide-23 01/19/2012  . Tdap 01/18/2010  . Zoster 01/19/2012    TDAP status: Up to date   Flu Vaccine status: Up to date   Pneumococcal vaccine status: Up to date   Covid-19 vaccine status: Completed vaccines  Qualifies for Shingles Vaccine? Yes   Zostavax completed Yes   Shingrix Completed?: No.    Education has been provided regarding the importance of this vaccine. Patient has been advised to call insurance company to determine out of pocket expense if they have not yet received  this vaccine. Advised may  also receive vaccine at local pharmacy or Health Dept. Verbalized acceptance and understanding.  Screening Tests Health Maintenance  Topic Date Due  . INFLUENZA VACCINE  08/18/2019  . TETANUS/TDAP  01/19/2020  . MAMMOGRAM  05/20/2020  . DEXA SCAN  Completed  . COVID-19 Vaccine  Completed  . PNA vac Low Risk Adult  Completed    Health Maintenance  Health Maintenance Due  Topic Date Due  . INFLUENZA VACCINE  08/18/2019    Colorectal cancer screening: No longer required.    Mammogram status: Completed 05/21/19. Repeat every year   Bone Density status: Completed 10/23/18. Results reflect: Bone density results: OSTEOPOROSIS. Repeat every 2 years.  Lung Cancer Screening: (Low Dose CT Chest recommended if Age 50-80 years, 30 pack-year currently smoking OR have quit w/in 15years.) does not qualify.   Additional Screening:  Hepatitis C Screening: no longer required  Vision Screening: Recommended annual ophthalmology exams for early detection of glaucoma and other disorders of the eye. Is the patient up to date with their annual eye exam?  Yes  Who is the provider or what is the name of the office in which the patient attends annual eye exams? [place] Screening: Recommended annual dental exams for proper oral hygiene  Community Resource Referral / Chronic Care Management: CRR required this visit?  No   CCM required this visit?  No      Plan:     I have personally reviewed and noted the following in the patient's chart:   . Medical and social history . Use of alcohol, tobacco or illicit drugs  . Current medications and supplements . Functional ability and status . Nutritional status . Physical activity . Advanced directives . List of other physicians . Hospitalizations, surgeries, and ER visits in previous 12 months . Vitals . Screenings to include cognitive, depression, and falls . Referrals and appointments  In addition, I have reviewed and  discussed with patient certain preventive protocols, quality metrics, and best practice recommendations. A written personalized care plan for preventive services as well as general preventive health recommendations were provided to patient.     [name], LPN   9/37/3428   Nurse Notes: pt c/o occasional pain in coccyx. Advised may take OTC tylenol if needed and to contact office if sxs worsen or persist.

## 2019-09-04 NOTE — Patient Instructions (Signed)
Evelyn Clements , Thank you for taking time to come for your Medicare Wellness Visit. I appreciate your ongoing commitment to your health goals. Please review the following plan we discussed and let me know if I can assist you in the future.   Screening recommendations/referrals: Colonoscopy: no longer required Mammogram: done 05/21/19 Bone Density: done 10/23/18. Due for next Reclast infusion at [facility] in Feb 2022 for osteoporosis Recommended yearly ophthalmology/optometry visit for glaucoma screening and checkup Recommended yearly dental visit for hygiene and checkup  Vaccinations: Influenza vaccine: done 11/03/18 Pneumococcal vaccine: done 06/06/14 Tdap vaccine: done 2012 Shingles vaccine: Shingrix discussed. Please contact your pharmacy for coverage information.  Covid-19:done 02/18/19 & 03/11/19  Conditions/risks identified: Recommend increasing physical activity and continue dancing!  Next appointment: Follow up in one year for your annual wellness visit    Preventive Care 65 Years and Older, Female Preventive care refers to lifestyle choices and visits with your health care provider that can promote health and wellness. What does preventive care include?  A yearly physical exam. This is also called an annual well check.  Dental exams once or twice a year.  Routine eye exams. Ask your health care provider how often you should have your eyes checked.  Personal lifestyle choices, including:  Daily care of your teeth and gums.  Regular physical activity.  Eating a healthy diet.  Avoiding tobacco and drug use.  Limiting alcohol use.  Practicing safe sex.  Taking low-dose aspirin every day.  Taking vitamin and mineral supplements as recommended by your health care provider. What happens during an annual well check? The services and screenings done by your health care provider during your annual well check will depend on your age, overall health, lifestyle risk factors,  and family history of disease. Counseling  Your health care provider may ask you questions about your:  Alcohol use.  Tobacco use.  Drug use.  Emotional well-being.  Home and relationship well-being.  Sexual activity.  Eating habits.  History of falls.  Memory and ability to understand (cognition).  Work and work Statistician.  Reproductive health. Screening  You may have the following tests or measurements:  Height, weight, and BMI.  Blood pressure.  Lipid and cholesterol levels. These may be checked every 5 years, or more frequently if you are over 13 years old.  Skin check.  Lung cancer screening. You may have this screening every year starting at age 82 if you have a 30-pack-year history of smoking and currently smoke or have quit within the past 15 years.  Fecal occult blood test (FOBT) of the stool. You may have this test every year starting at age 82.  Flexible sigmoidoscopy or colonoscopy. You may have a sigmoidoscopy every 5 years or a colonoscopy every 10 years starting at age 82.  Hepatitis C blood test.  Hepatitis B blood test.  Sexually transmitted disease (STD) testing.  Diabetes screening. This is done by checking your blood sugar (glucose) after you have not eaten for a while (fasting). You may have this done every 1-3 years.  Bone density scan. This is done to screen for osteoporosis. You may have this done starting at age 82.  Mammogram. This may be done every 1-2 years. Talk to your health care provider about how often you should have regular mammograms. Talk with your health care provider about your test results, treatment options, and if necessary, the need for more tests. Vaccines  Your health care provider may recommend certain vaccines, such as:  Influenza vaccine. This is recommended every year.  Tetanus, diphtheria, and acellular pertussis (Tdap, Td) vaccine. You may need a Td booster every 10 years.  Zoster vaccine. You may need  this after age 82.  Pneumococcal 13-valent conjugate (PCV13) vaccine. One dose is recommended after age 45.  Pneumococcal polysaccharide (PPSV23) vaccine. One dose is recommended after age 43. Talk to your health care provider about which screenings and vaccines you need and how often you need them. This information is not intended to replace advice given to you by your health care provider. Make sure you discuss any questions you have with your health care provider. Document Released: 01/30/2015 Document Revised: 09/23/2015 Document Reviewed: 11/04/2014 Elsevier Interactive Patient Education  2017 [place] Prevention in the Home Falls can cause injuries. They can happen to people of all ages. There are many things you can do to make your home safe and to help prevent falls. What can I do on the outside of my home?  Regularly fix the edges of walkways and driveways and fix any cracks.  Remove anything that might make you trip as you walk through a door, such as a raised step or threshold.  Trim any bushes or trees on the path to your home.  Use bright outdoor lighting.  Clear any walking paths of anything that might make someone trip, such as rocks or tools.  Regularly check to see if handrails are loose or broken. Make sure that both sides of any steps have handrails.  Any raised decks and porches should have guardrails on the edges.  Have any leaves, snow, or ice cleared regularly.  Use sand or salt on walking paths during winter.  Clean up any spills in your garage right away. This includes oil or grease spills. What can I do in the bathroom?  Use night lights.  Install grab bars by the toilet and in the tub and shower. Do not use towel bars as grab bars.  Use non-skid mats or decals in the tub or shower.  If you need to sit down in the shower, use a plastic, non-slip stool.  Keep the floor dry. Clean up any water that spills on the floor as soon as it  happens.  Remove soap buildup in the tub or shower regularly.  Attach bath mats securely with double-sided non-slip rug tape.  Do not have throw rugs and other things on the floor that can make you trip. What can I do in the bedroom?  Use night lights.  Make sure that you have a light by your bed that is easy to reach.  Do not use any sheets or blankets that are too big for your bed. They should not hang down onto the floor.  Have a firm chair that has side arms. You can use this for support while you get dressed.  Do not have throw rugs and other things on the floor that can make you trip. What can I do in the kitchen?  Clean up any spills right away.  Avoid walking on wet floors.  Keep items that you use a lot in easy-to-reach places.  If you need to reach something above you, use a strong step stool that has a grab bar.  Keep electrical cords out of the way.  Do not use floor polish or wax that makes floors slippery. If you must use wax, use non-skid floor wax.  Do not have throw rugs and other things on the floor that  can make you trip. What can I do with my stairs?  Do not leave any items on the stairs.  Make sure that there are handrails on both sides of the stairs and use them. Fix handrails that are broken or loose. Make sure that handrails are as long as the stairways.  Check any carpeting to make sure that it is firmly attached to the stairs. Fix any carpet that is loose or worn.  Avoid having throw rugs at the top or bottom of the stairs. If you do have throw rugs, attach them to the floor with carpet tape.  Make sure that you have a light switch at the top of the stairs and the bottom of the stairs. If you do not have them, ask someone to add them for you. What else can I do to help prevent falls?  Wear shoes that:  Do not have high heels.  Have rubber bottoms.  Are comfortable and fit you well.  Are closed at the toe. Do not wear sandals.  If you  use a stepladder:  Make sure that it is fully opened. Do not climb a closed stepladder.  Make sure that both sides of the stepladder are locked into place.  Ask someone to hold it for you, if possible.  Clearly mark and make sure that you can see:  Any grab bars or handrails.  First and last steps.  Where the edge of each step is.  Use tools that help you move around (mobility aids) if they are needed. These include:  Canes.  Walkers.  Scooters.  Crutches.  Turn on the lights when you go into a dark area. Replace any light bulbs as soon as they burn out.  Set up your furniture so you have a clear path. Avoid moving your furniture around.  If any of your floors are uneven, fix them.  If there are any pets around you, be aware of where they are.  Review your medicines with your doctor. Some medicines can make you feel dizzy. This can increase your chance of falling. Ask your doctor what other things that you can do to help prevent falls. This information is not intended to replace advice given to you by your health care provider. Make sure you discuss any questions you have with your health care provider. Document Released: 10/30/2008 Document Revised: 06/11/2015 Document Reviewed: 02/07/2014 Elsevier Interactive Patient Education  2017 [employer].

## 2019-10-08 ENCOUNTER — Telehealth: Payer: Self-pay | Admitting: Internal Medicine

## 2019-10-08 NOTE — Telephone Encounter (Signed)
Copied from [place] [id number]. Topic: Referral - Request for Referral >> Oct 08, 2019  3:58 PM Oneta Rack wrote: Patient requesting a referral to a gynecologist, patient states a tumor was found please advise once referral is placed

## 2019-10-09 ENCOUNTER — Other Ambulatory Visit: Payer: Medicare Other

## 2019-10-09 DIAGNOSIS — Z20822 Contact with and (suspected) exposure to covid-19: Secondary | ICD-10-CM

## 2019-10-09 NOTE — Telephone Encounter (Signed)
Patient scheduled to see Dr. Army Melia on Friday at 3pm.  CM

## 2019-10-11 ENCOUNTER — Ambulatory Visit (INDEPENDENT_AMBULATORY_CARE_PROVIDER_SITE_OTHER): Payer: Medicare Other | Admitting: Internal Medicine

## 2019-10-11 ENCOUNTER — Encounter: Payer: Self-pay | Admitting: Internal Medicine

## 2019-10-11 ENCOUNTER — Other Ambulatory Visit: Payer: Self-pay

## 2019-10-11 VITALS — BP 140/68 | HR 73 | Temp 98.1°F | Ht 62.0 in | Wt 163.0 lb

## 2019-10-11 DIAGNOSIS — K439 Ventral hernia without obstruction or gangrene: Secondary | ICD-10-CM | POA: Diagnosis not present

## 2019-10-11 LAB — SARS-COV-2, NAA 2 DAY TAT

## 2019-10-11 LAB — NOVEL CORONAVIRUS, NAA: SARS-CoV-2, NAA: NOT DETECTED
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
## 2019-10-22 ENCOUNTER — Encounter: Payer: Self-pay | Admitting: Surgery

## 2019-10-22 ENCOUNTER — Ambulatory Visit (INDEPENDENT_AMBULATORY_CARE_PROVIDER_SITE_OTHER): Payer: Medicare Other | Admitting: Surgery

## 2019-10-22 ENCOUNTER — Other Ambulatory Visit: Payer: Self-pay

## 2019-10-22 VITALS — BP 140/88 | HR 76 | Temp 97.9°F | Resp 12 | Ht 60.0 in | Wt 163.2 lb

## 2019-10-22 DIAGNOSIS — R1084 Generalized abdominal pain: Secondary | ICD-10-CM | POA: Diagnosis not present

## 2019-10-22 DIAGNOSIS — K4020 Bilateral inguinal hernia, without obstruction or gangrene, not specified as recurrent: Secondary | ICD-10-CM | POA: Insufficient documentation

## 2019-10-22 DIAGNOSIS — K4090 Unilateral inguinal hernia, without obstruction or gangrene, not specified as recurrent: Secondary | ICD-10-CM
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
## 2019-10-22 NOTE — Patient Instructions (Addendum)
We will get you set up for a CT scan of the abdomen and pelvis with contrast to assess the area.  We will call you with these results and the next steps.  You have been scheduled for a CT abdomen/pelvis with at [facility] on 11/06/19 at 9:30 am. You will arrive there by 9:15 am and have nothing to eat or drink for 4 hours prior. You will need to pick up prep kit.

## 2019-10-31 ENCOUNTER — Telehealth: Payer: Self-pay

## 2019-10-31 NOTE — Telephone Encounter (Signed)
Copied from [place] [id number]. Topic: General - Inquiry >> Oct 31, 2019  2:56 PM Greggory Keen D wrote: Reason for CRM: Pt called in regards to a hernia operation she is supposed  to have up coming.  She states she has not heard back from anyone in regards to the surgery  CB#  [phone]

## 2019-11-01 NOTE — Telephone Encounter (Signed)
Spoke with patient. Explained he is her surgeon and it looks as they they are doing a CT Scan on 11/06/19 before surgery. She said no onehas told her that. Gave her Dr Forest Becker office number and told her to call their office to get clarification.  CM

## 2019-11-06 ENCOUNTER — Other Ambulatory Visit: Payer: Self-pay

## 2019-11-06 ENCOUNTER — Ambulatory Visit
Admission: RE | Admit: 2019-11-06 | Discharge: 2019-11-06 | Disposition: A | Discharge status: Home / Self Care | Payer: Medicare Other | Source: Ambulatory Visit | Attending: Surgery | Admitting: Surgery

## 2019-11-06 DIAGNOSIS — E782 Mixed hyperlipidemia: Secondary | ICD-10-CM | POA: Diagnosis not present

## 2019-11-06 DIAGNOSIS — K760 Fatty (change of) liver, not elsewhere classified: Secondary | ICD-10-CM | POA: Diagnosis not present

## 2019-11-06 DIAGNOSIS — I10 Essential (primary) hypertension: Secondary | ICD-10-CM | POA: Diagnosis not present

## 2019-11-06 DIAGNOSIS — R1084 Generalized abdominal pain: Secondary | ICD-10-CM

## 2019-11-06 LAB — POCT I-STAT CREATININE: Creatinine, Ser: 1 mg/dL (ref 0.44–1.00)

## 2019-11-06 MED ORDER — IOHEXOL 300 MG/ML  SOLN
100.0000 mL | Freq: Once | INTRAMUSCULAR | Status: AC | PRN
  Administered 2019-11-06: 100 mL via INTRAVENOUS

## 2019-11-21 ENCOUNTER — Telehealth: Payer: Self-pay

## 2019-11-21 NOTE — Telephone Encounter (Unsigned)
Copied from [place] [phone]. Topic: General - Other >> Nov 21, 2019  4:22 PM Antonieta Iba C wrote: Reason for CRM: pt called in for assistance. Pt says that she was suppose to receive a call to schedule a surgery for a hernia,, pt says that she hasn't received it . Pt is calling in to follow up on referral.   Pt would like further assistance.

## 2019-11-22 NOTE — Telephone Encounter (Signed)
Called pt told her that I have contacted the RF team about her RF. Pt verbalized understanding.   Reached out to [employer]

## 2019-11-26 ENCOUNTER — Ambulatory Visit: Payer: Medicare Other | Admitting: Surgery

## 2019-11-28 ENCOUNTER — Ambulatory Visit (INDEPENDENT_AMBULATORY_CARE_PROVIDER_SITE_OTHER): Payer: Medicare Other | Admitting: Surgery

## 2019-11-28 ENCOUNTER — Encounter: Payer: Self-pay | Admitting: Surgery

## 2019-11-28 ENCOUNTER — Other Ambulatory Visit: Payer: Self-pay

## 2019-11-28 VITALS — BP 166/80 | HR 70 | Temp 97.7°F | Ht 61.0 in | Wt 165.0 lb

## 2019-11-28 DIAGNOSIS — K4020 Bilateral inguinal hernia, without obstruction or gangrene, not specified as recurrent: Secondary | ICD-10-CM

## 2019-11-28 NOTE — Patient Instructions (Signed)
Please call our office with any questions or concerns. 

## 2019-11-28 NOTE — Progress Notes (Signed)
Surgical Clinic Progress/Follow-up Note   HPI:  [name] presents to clinic for CT scan examination follow-up   Patient reports no groin pain.  Tolerating regular diet with +flatus and normal BM's, denies N/V, fever/chills, CP, or SOB.  Upon review of her minimal symptoms, we reviewed the CT scan images to explain what she is noting on exam.  And as she is having so little difficulty with them, has no interest in pursuing repair.  Review of Systems:  Constitutional: denies fever/chills  ENT: denies sore throat, hearing problems  Respiratory: denies shortness of breath, wheezing  Cardiovascular: denies chest pain, palpitations  Gastrointestinal: denies abdominal pain, N/V, or diarrhea/and bowel function as per interval history Skin: Denies any other rashes or skin discolorations Vital Signs:  BP (!) 166/80   Pulse 70   Temp 97.7 F (36.5 C) (Oral)   Ht 5\' 1"  (1.549 m)   Wt 165 lb (74.8 kg)   SpO2 96%   BMI 31.18 kg/m    Physical Exam:  No repeated.   Laboratory studies: Radiology review:  CLINICAL DATA:  Bulge in right groin, question hernia  EXAM: CT ABDOMEN AND PELVIS WITH CONTRAST  TECHNIQUE: Multidetector CT imaging of the abdomen and pelvis was performed using the standard protocol following bolus administration of intravenous contrast.  CONTRAST:  152mL OMNIPAQUE IOHEXOL 300 MG/ML  SOLN  COMPARISON:  None.  FINDINGS: Lower chest: Large hiatal hernia. Densely calcified mitral valve annulus. Coronary artery and aortic calcifications. No acute abnormality.  Hepatobiliary: Diffuse low-density throughout the liver compatible with fatty infiltration. No focal abnormality. Gallbladder unremarkable.  Pancreas: No focal abnormality or ductal dilatation.  Spleen: No focal abnormality.  Normal size.  Adrenals/Urinary Tract: Small bilateral renal cysts. No hydronephrosis. Adrenal glands and urinary bladder unremarkable.  Stomach/Bowel: Sigmoid  diverticulosis.  No active diverticulitis.  Vascular/Lymphatic: Aortic atherosclerosis. No evidence of aneurysm or adenopathy.  Reproductive: Prior hysterectomy.  No adnexal masses.  Other: No free fluid or free air. Bilateral inguinal hernias noted containing fat, right larger than left.  Musculoskeletal: Degenerative disc and facet disease in the lumbar spine. Grade 1 anterolisthesis of L4 on L5 and L5 on S1.  IMPRESSION: Bilateral inguinal hernias containing fat, right larger than left.  Left colonic diverticulosis.  No active diverticulitis.  Hepatic steatosis  Aortic atherosclerosis.   Electronically Signed   By: Evelyn Clements M.D.   On: 11/06/2019 23:11     Assessment:  84 y.o. yo Female with a problem list including...  Patient Active Problem List   Diagnosis Date Noted  . Right groin hernia 10/22/2019  . Mild cognitive impairment 09/21/2018  . RBC microcytosis 09/21/2018  . DDD (degenerative disc disease), lumbar 05/07/2018  . Spondylolisthesis of lumbar region 05/07/2018  . Dermoid cyst of scalp 03/16/2018  . Eczema 12/18/2017  . Chronic bilateral low back pain without sciatica 06/22/2016  . Hiatal hernia   . Sebaceous cyst of labia 06/01/2015  . Epidermal inclusion cyst 06/01/2015  . Barrett esophagus 06/21/2014  . Essential (primary) hypertension 06/21/2014  . Fibrocystic breast 06/21/2014  . Insomnia, persistent 06/21/2014  . Hyperlipidemia, mixed 06/21/2014  . OP (osteoporosis) 06/21/2014  . Arthritis of knee, degenerative 06/21/2014    presents to clinic for follow-up evaluation of bilateral fat filled groin hernias, essentially asymptomatic aside from noting the presence in the mirror.  Plan:              - return to clinic as needed, instructed to call office if any questions  or concerns  All of the above recommendations were discussed with the patient and patient's family, and all of patient's and family's questions were answered to  their expressed satisfaction.  Evelyn Bacon, MD, FACS [place]: [facility] exceptional care. Office: [phone]

## 2020-01-20 ENCOUNTER — Other Ambulatory Visit
Admission: RE | Admit: 2020-01-20 | Discharge: 2020-01-20 | Disposition: A | Discharge status: Home / Self Care | Payer: Medicare HMO | Attending: Internal Medicine | Admitting: Internal Medicine

## 2020-01-20 ENCOUNTER — Encounter: Payer: Self-pay | Admitting: Internal Medicine

## 2020-01-20 ENCOUNTER — Other Ambulatory Visit: Payer: Self-pay

## 2020-01-20 ENCOUNTER — Ambulatory Visit (INDEPENDENT_AMBULATORY_CARE_PROVIDER_SITE_OTHER): Payer: Medicare HMO | Admitting: Internal Medicine

## 2020-01-20 VITALS — BP 132/68 | HR 67 | Temp 97.5°F | Ht 62.0 in | Wt 163.0 lb

## 2020-01-20 DIAGNOSIS — M810 Age-related osteoporosis without current pathological fracture: Secondary | ICD-10-CM | POA: Diagnosis not present

## 2020-01-20 DIAGNOSIS — I700 Atherosclerosis of aorta: Secondary | ICD-10-CM | POA: Insufficient documentation

## 2020-01-20 DIAGNOSIS — K2270 Barrett's esophagus without dysplasia: Secondary | ICD-10-CM | POA: Insufficient documentation

## 2020-01-20 DIAGNOSIS — I10 Essential (primary) hypertension: Secondary | ICD-10-CM

## 2020-01-20 DIAGNOSIS — Z1231 Encounter for screening mammogram for malignant neoplasm of breast: Secondary | ICD-10-CM | POA: Diagnosis not present

## 2020-01-20 DIAGNOSIS — G3184 Mild cognitive impairment, so stated: Secondary | ICD-10-CM | POA: Diagnosis not present

## 2020-01-20 DIAGNOSIS — Z0000 Encounter for general adult medical examination without abnormal findings: Secondary | ICD-10-CM | POA: Diagnosis not present

## 2020-01-20 LAB — LIPID PANEL
Cholesterol: 283 mg/dL — ABNORMAL HIGH (ref 0–200)
HDL: 80 mg/dL
LDL Cholesterol: 167 mg/dL — ABNORMAL HIGH (ref 0–99)
Total CHOL/HDL Ratio: 3.5 ratio
Triglycerides: 181 mg/dL — ABNORMAL HIGH
VLDL: 36 mg/dL (ref 0–40)

## 2020-01-20 LAB — CBC WITH DIFFERENTIAL/PLATELET
Abs Immature Granulocytes: 0.03 K/uL (ref 0.00–0.07)
Basophils Absolute: 0.1 K/uL (ref 0.0–0.1)
Basophils Relative: 1 %
Eosinophils Absolute: 0.1 K/uL (ref 0.0–0.5)
Eosinophils Relative: 2 %
HCT: 43.3 % (ref 36.0–46.0)
Hemoglobin: 14.2 g/dL (ref 12.0–15.0)
Immature Granulocytes: 0 %
Lymphocytes Relative: 15 %
Lymphs Abs: 1.2 K/uL (ref 0.7–4.0)
MCH: 29.2 pg (ref 26.0–34.0)
MCHC: 32.8 g/dL (ref 30.0–36.0)
MCV: 89.1 fL (ref 80.0–100.0)
Monocytes Absolute: 0.6 K/uL (ref 0.1–1.0)
Monocytes Relative: 7 %
Neutro Abs: 6 K/uL (ref 1.7–7.7)
Neutrophils Relative %: 75 %
Platelets: 221 K/uL (ref 150–400)
RBC: 4.86 MIL/uL (ref 3.87–5.11)
RDW: 14.1 % (ref 11.5–15.5)
WBC: 7.9 K/uL (ref 4.0–10.5)
nRBC: 0 % (ref 0.0–0.2)

## 2020-01-20 LAB — COMPREHENSIVE METABOLIC PANEL WITH GFR
ALT: 19 U/L (ref 0–44)
AST: 23 U/L (ref 15–41)
Albumin: 3.9 g/dL (ref 3.5–5.0)
Alkaline Phosphatase: 68 U/L (ref 38–126)
Anion gap: 11 (ref 5–15)
BUN: 20 mg/dL (ref 8–23)
CO2: 26 mmol/L (ref 22–32)
Calcium: 9.1 mg/dL (ref 8.9–10.3)
Chloride: 102 mmol/L (ref 98–111)
Creatinine, Ser: 1.04 mg/dL — ABNORMAL HIGH (ref 0.44–1.00)
GFR, Estimated: 54 mL/min — ABNORMAL LOW
Glucose, Bld: 124 mg/dL — ABNORMAL HIGH (ref 70–99)
Potassium: 3.5 mmol/L (ref 3.5–5.1)
Sodium: 139 mmol/L (ref 135–145)
Total Bilirubin: 0.5 mg/dL (ref 0.3–1.2)
Total Protein: 6.9 g/dL (ref 6.5–8.1)

## 2020-01-20 LAB — TSH: TSH: 5.386 u[IU]/mL — ABNORMAL HIGH (ref 0.350–4.500)

## 2020-01-20 NOTE — Patient Instructions (Addendum)
Check on aricept at home - see if she is taking it.  May sure that the dose of Zoloft is 50 mg.  Check with Endocrinology about appt in late January  O'Connell, Thomas Lynch, MD  Endocrinology  [address] [place] [address]    Phone: [phone]  Fax: [phone]    Alphia Kava, NP  Endocrinology  [address]  [place] [address]    Phone: [phone]  Fax: [phone]
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
## 2020-04-02 ENCOUNTER — Other Ambulatory Visit: Payer: Self-pay | Admitting: Internal Medicine

## 2020-04-02 DIAGNOSIS — I10 Essential (primary) hypertension: Secondary | ICD-10-CM

## 2020-04-02 MED ORDER — HYDROCHLOROTHIAZIDE 12.5 MG PO TABS: 12.5000 mg | ORAL_TABLET | Freq: Every day | ORAL | 2 refills | Status: DC

## 2020-04-02 NOTE — Telephone Encounter (Signed)
Medication: diltiazem (TIAZAC) 120 MG 24 hr capsule [[id number]] , donepezil (ARICEPT) 5 MG tablet [[id number]]  DISCONTINUED, hydrochlorothiazide (HYDRODIURIL) 12.5 MG tablet [[id number]]   Has the patient contacted their pharmacy? YES (Agent: If no, request that the patient contact the pharmacy for the refill.) (Agent: If yes, when and what did the pharmacy advise?)  Preferred Pharmacy (with phone number or street name): [facility] [address] [place], [place] [address] [phone]  Agent: Please be advised that RX refills may take up to 3 business days. We ask that you follow-up with your pharmacy.

## 2020-04-02 NOTE — Telephone Encounter (Signed)
Requested medications are due for refill today yes  Requested medications are on the active medication list ye  Last visit 01/2020  Future visit scheduled yes 08/2020 and 01/2021  Notes to clinic This was from Historical Provider

## 2020-04-03 ENCOUNTER — Other Ambulatory Visit: Payer: Self-pay

## 2020-04-03 NOTE — Telephone Encounter (Signed)
Evelyn Clements, form [facility] pharmacy calling to have these refills sent over for pt. Please advise.

## 2020-04-03 NOTE — Telephone Encounter (Signed)
Informed patient of message.  

## 2020-05-11 DIAGNOSIS — F0281 Dementia in other diseases classified elsewhere with behavioral disturbance: Secondary | ICD-10-CM | POA: Diagnosis not present

## 2020-05-11 DIAGNOSIS — G301 Alzheimer's disease with late onset: Secondary | ICD-10-CM | POA: Diagnosis not present

## 2020-05-28 IMAGING — MG DIGITAL SCREENING BILAT W/ TOMO W/ CAD
8 series · 8 of 24 positions shown · non-contrast
Comparison: Previous exam(s).

CLINICAL DATA: Screening.

EXAM:
DIGITAL SCREENING BILATERAL MAMMOGRAM WITH TOMO AND CAD

[L CC synth-2D]
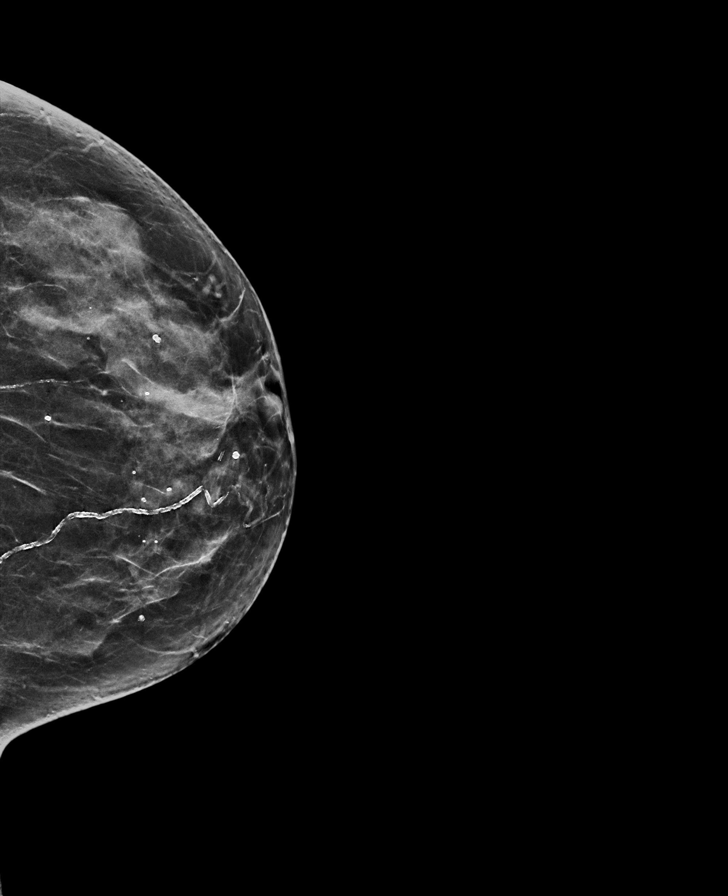

[R CC synth-2D]
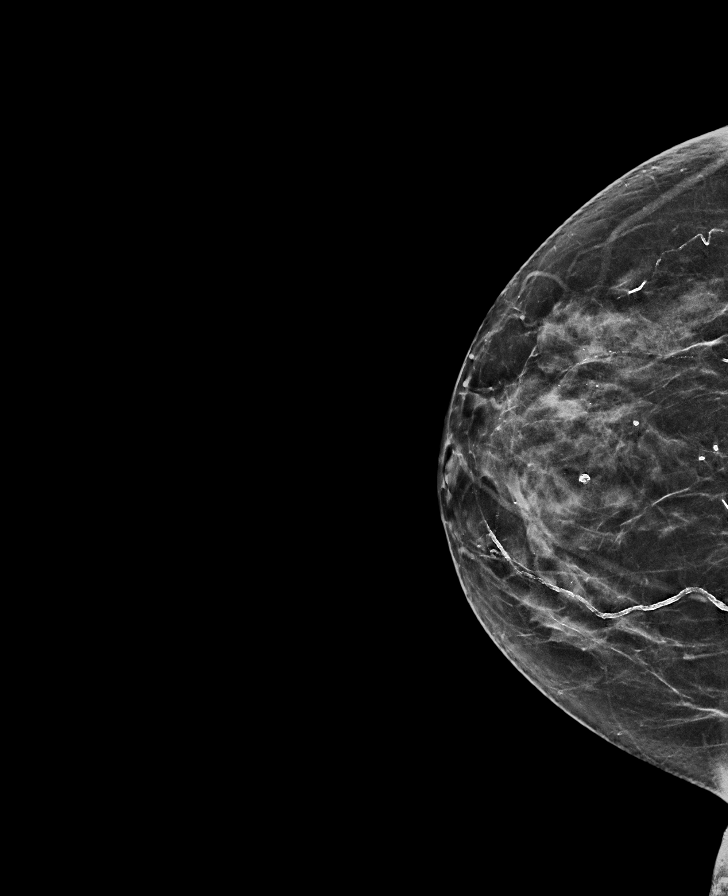

[R MLO synth-2D]
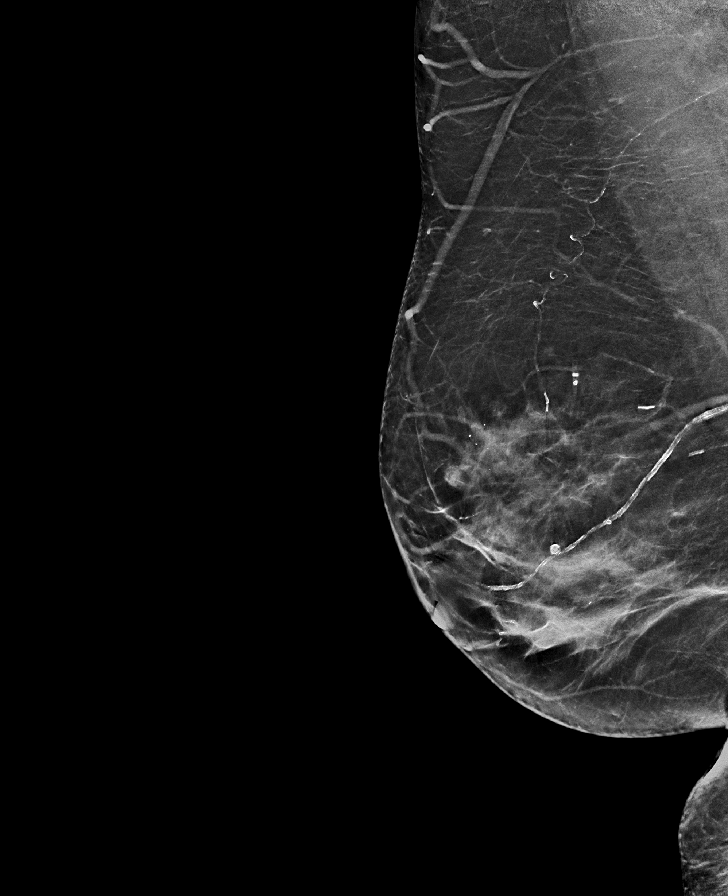

[L MLO synth-2D]
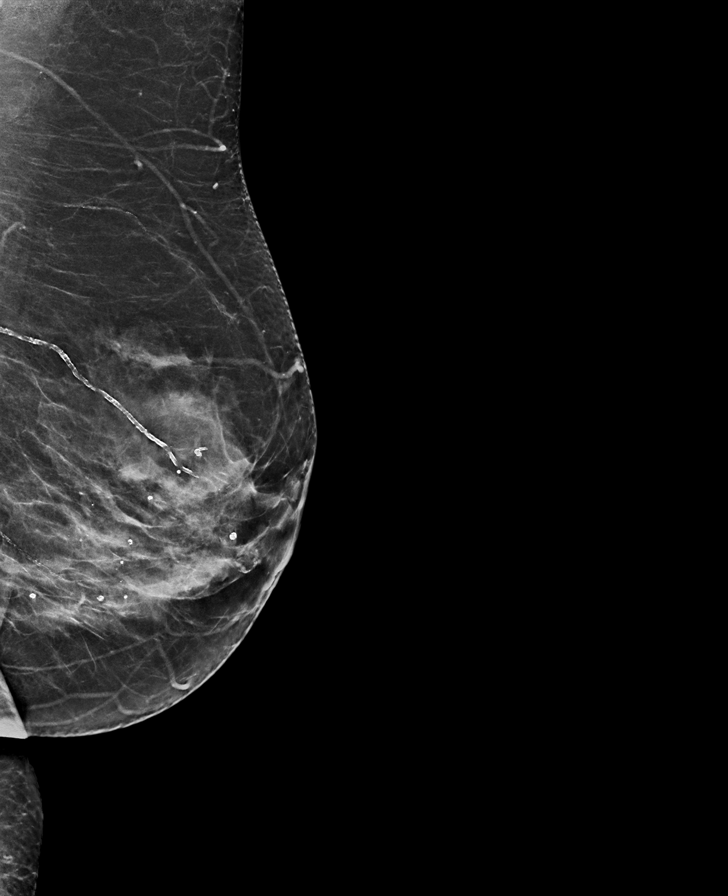

[L CC tomo · tomo slice 33/64.0]
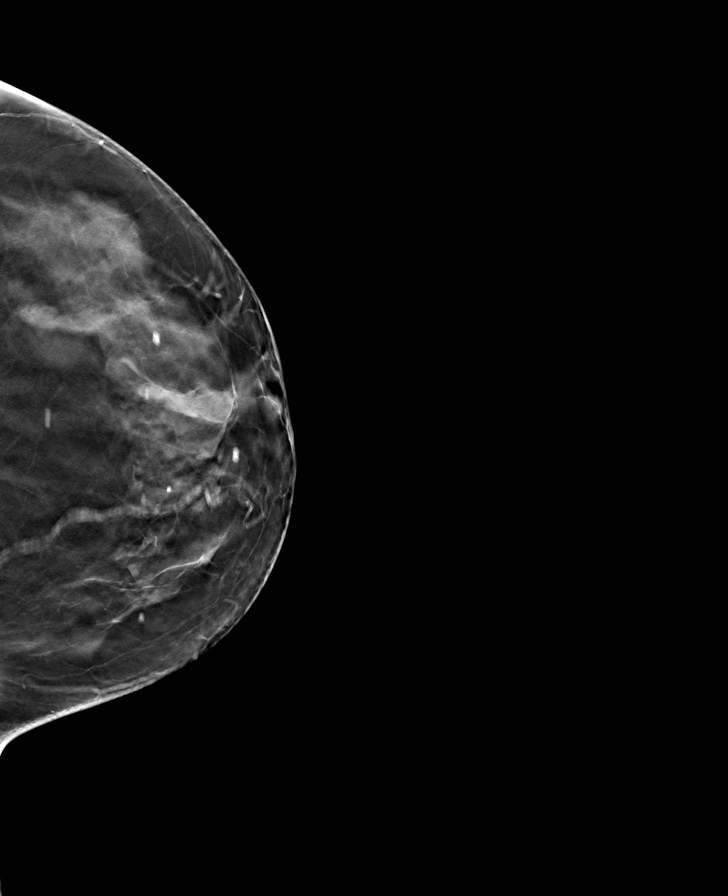

[R CC tomo · tomo slice 30/59.0]
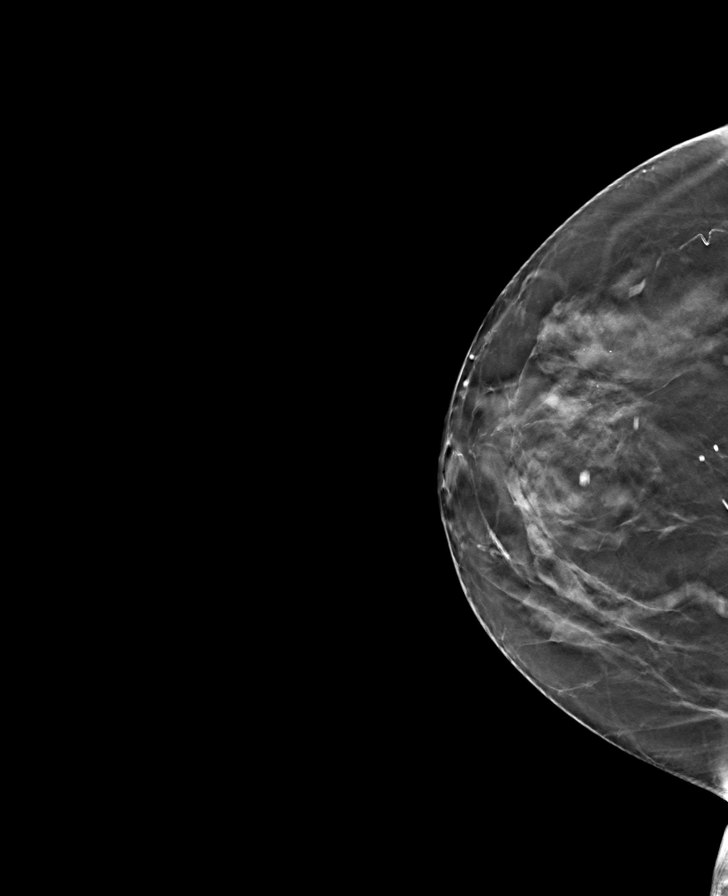

[L MLO tomo · tomo slice 33/66.0]
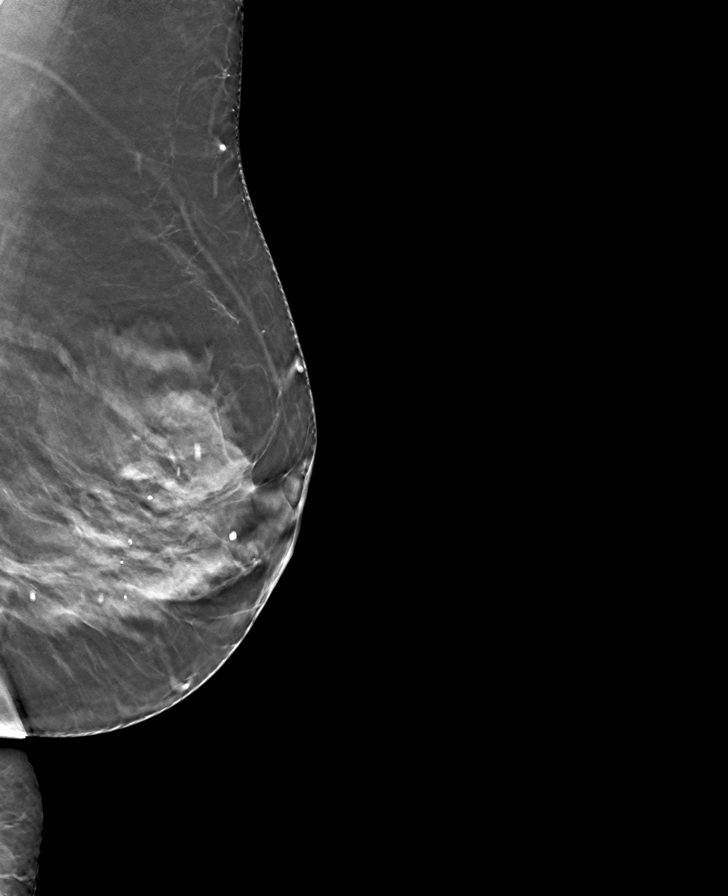

[R MLO tomo · tomo slice 33/66.0]
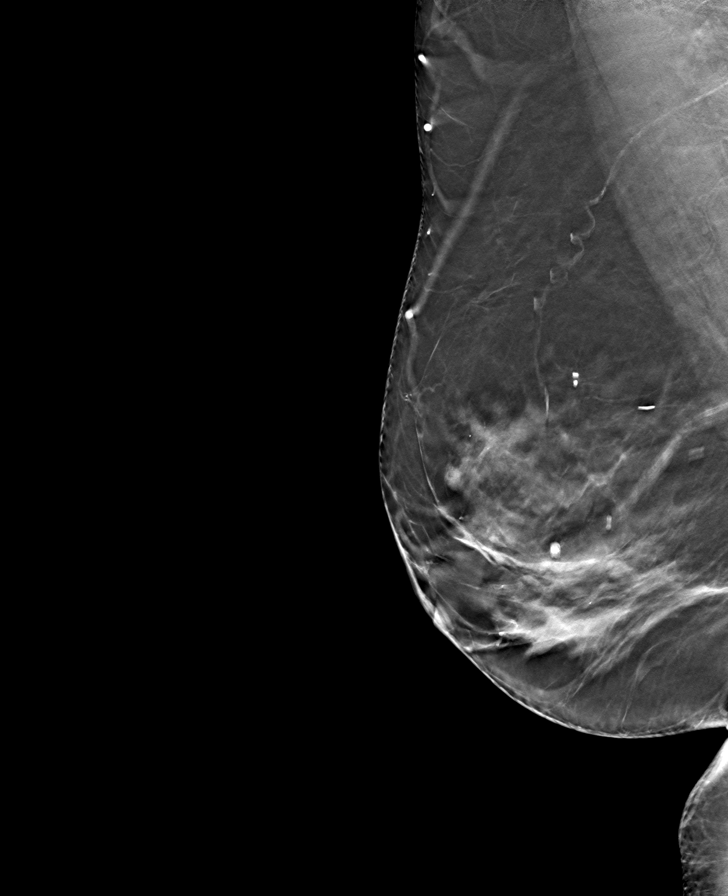

[8 of 24 positions shown; findings below may reference images not displayed]

ACR Breast Density Category c: The breast tissue is heterogeneously
dense, which may obscure small masses.
FINDINGS: There are no findings suspicious for malignancy. Images were
processed with CAD.
IMPRESSION: No mammographic evidence of malignancy. A result letter of this
screening mammogram will be mailed directly to the patient.

RECOMMENDATION:
Screening mammogram in one year. (Code:[id number])

BI-RADS CATEGORY  1: Negative.

## 2020-06-29 ENCOUNTER — Other Ambulatory Visit: Payer: Self-pay

## 2020-06-29 ENCOUNTER — Ambulatory Visit: Payer: Medicare PPO | Admitting: Internal Medicine

## 2020-06-29 ENCOUNTER — Encounter: Payer: Self-pay | Admitting: Internal Medicine

## 2020-06-29 VITALS — BP 140/80 | HR 70 | Temp 98.0°F | Ht 62.0 in | Wt 160.0 lb

## 2020-06-29 DIAGNOSIS — R1900 Intra-abdominal and pelvic swelling, mass and lump, unspecified site: Secondary | ICD-10-CM | POA: Diagnosis not present

## 2020-06-29 DIAGNOSIS — I10 Essential (primary) hypertension: Secondary | ICD-10-CM
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
## 2020-07-01 ENCOUNTER — Telehealth: Payer: Self-pay

## 2020-07-01 NOTE — Telephone Encounter (Unsigned)
Copied from [place] [id number]. Topic: General - Other >> Jun 30, 2020  4:04 PM Keene Breath wrote: Reason for CRM: Patient would like the nurse to call her regarding her memory changes.  She stated that at her recent appt. She forgot to talk to the doctor about her memory and she is concerned because she is having problems remembering things.  Please advise and call patient to see if she should schedule an appt. Or just speak with the nurse.  CB# [phone]

## 2020-07-02 ENCOUNTER — Ambulatory Visit: Payer: Medicare PPO | Admitting: Internal Medicine

## 2020-07-14 ENCOUNTER — Telehealth: Payer: Self-pay

## 2020-07-14 NOTE — Telephone Encounter (Signed)
Copied from [place] [id number]. Topic: General - Other >> Jul 14, 2020 11:00 AM Tessa Lerner A wrote: Reason for CRM: Patient's daughter would like to be called regarding patient's most recent visit on 07/02/20  Patient's memory concerns have made it difficult for the patient to share information from visits  Please contact to further advise

## 2020-07-14 NOTE — Telephone Encounter (Signed)
Called pt daughter she stated mass on pt is getting bigger. Scheduled pt an appt to be seen. Per Dr. Army Melia not if there are any changes she needs to be seen.  KP

## 2020-07-16 DIAGNOSIS — R279 Unspecified lack of coordination: Secondary | ICD-10-CM | POA: Diagnosis not present

## 2020-07-16 DIAGNOSIS — F0280 Dementia in other diseases classified elsewhere without behavioral disturbance: Secondary | ICD-10-CM | POA: Diagnosis not present

## 2020-07-16 DIAGNOSIS — M6281 Muscle weakness (generalized): Secondary | ICD-10-CM | POA: Diagnosis not present

## 2020-07-16 DIAGNOSIS — F0390 Unspecified dementia without behavioral disturbance: Secondary | ICD-10-CM | POA: Diagnosis not present

## 2020-07-17 ENCOUNTER — Encounter: Payer: Self-pay | Admitting: Internal Medicine

## 2020-07-17 ENCOUNTER — Other Ambulatory Visit: Payer: Self-pay

## 2020-07-17 ENCOUNTER — Ambulatory Visit: Payer: Medicare (Managed Care) | Admitting: Internal Medicine

## 2020-07-17 VITALS — BP 134/76 | HR 63 | Temp 98.2°F | Ht 62.0 in | Wt 159.0 lb

## 2020-07-17 DIAGNOSIS — K4090 Unilateral inguinal hernia, without obstruction or gangrene, not specified as recurrent: Secondary | ICD-10-CM | POA: Diagnosis not present
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
## 2020-08-17 ENCOUNTER — Encounter: Payer: Self-pay | Admitting: Surgery

## 2020-08-17 ENCOUNTER — Ambulatory Visit: Payer: Medicare (Managed Care) | Admitting: Surgery

## 2020-08-17 ENCOUNTER — Telehealth: Payer: Self-pay

## 2020-08-17 ENCOUNTER — Other Ambulatory Visit: Payer: Self-pay

## 2020-08-17 VITALS — BP 151/50 | HR 65 | Temp 97.6°F | Ht 63.0 in | Wt 161.0 lb

## 2020-08-17 DIAGNOSIS — K4020 Bilateral inguinal hernia, without obstruction or gangrene, not specified as recurrent: Secondary | ICD-10-CM | POA: Diagnosis not present

## 2020-08-17 NOTE — Patient Instructions (Signed)
If you have any concerns or questions, please feel free to call our office.    Inguinal Hernia, Adult An inguinal hernia is when fat or your intestines push through a weak spot in a muscle where your leg meets your lower belly (groin). This causes a bulge. This kind of hernia could also be: In your scrotum, if you are female. In folds of skin around your vagina, if you are female. There are three types of inguinal hernias: Hernias that can be pushed back into the belly (are reducible). This type rarely causes pain. Hernias that cannot be pushed back into the belly (are incarcerated). Hernias that cannot be pushed back into the belly and lose their blood supply (are strangulated). This type needs emergency surgery. What are the causes? This condition is caused by having a weak spot in the muscles or tissues in your groin. This develops over time. The hernia may poke through the weak spot when you strain your lower belly muscles all of a sudden, such as when you: Lift a heavy object. Strain to poop (have a bowel movement). Trouble pooping (constipation) can lead to straining. Cough. What increases the risk? This condition is more likely to develop in: Males. Pregnant females. People who: Are overweight. Work in jobs that require long periods of standing or heavy lifting. Have had an inguinal hernia before. Smoke or have lung disease. These factors can lead to long-term (chronic) coughing. What are the signs or symptoms? Symptoms may depend on the size of the hernia. Often, a small hernia has no symptoms. Symptoms of a larger hernia may include: A bulge in the groin area. This is easier to see when standing. You might not be able to see it when you are lying down. Pain or burning in the groin. This may get worse when you lift, strain, or cough. A dull ache or a feeling of pressure in the groin. An abnormal bulge in the scrotum, in males. Symptoms of a strangulated inguinal hernia may  include: A bulge in your groin that is very painful and tender to the touch. A bulge that turns red or purple. Fever, feeling like you may vomit (nausea), and vomiting. Not being able to poop or to pass gas. How is this treated? Treatment depends on the size of your hernia and whether you have symptoms. If you do not have symptoms, your doctor may have you watch your hernia carefully and have you come in for follow-up visits. If your hernia is large or if youhave symptoms, you may need surgery to repair the hernia. Follow these instructions at home: Lifestyle Avoid lifting heavy objects. Avoid standing for long amounts of time. Do not smoke or use any products that contain nicotine or tobacco. If you need help quitting, ask your doctor. Stay at a healthy weight. Prevent trouble pooping You may need to take these actions to prevent or treat trouble pooping: Drink enough fluid to keep your pee (urine) pale yellow. Take over-the-counter or prescription medicines. Eat foods that are high in fiber. These include beans, whole grains, and fresh fruits and vegetables. Limit foods that are high in fat and sugar. These include fried or sweet foods. General instructions You may try to push your hernia back in place by very gently pressing on it when you are lying down. Do not try to push the bulge back in if it will not go in easily. Watch your hernia for any changes in shape, size, or color. Tell your doctor if  you see any changes. Take over-the-counter and prescription medicines only as told by your doctor. Keep all follow-up visits. Contact a doctor if: You have a fever or chills. You have new symptoms. Your symptoms get worse. Get help right away if: You have pain in your groin that gets worse all of a sudden. You have a bulge in your groin that: Gets bigger all of a sudden, and it does not get smaller after that. Turns red or purple. Is painful when you touch it. You are a female, and you  have: Sudden pain in your scrotum. A sudden change in the size of your scrotum. You cannot push the hernia back in place by very gently pressing on it when you are lying down. You feel like you may vomit, and that feeling does not go away. You keep vomiting. You have a fast heartbeat. You cannot poop or pass gas. These symptoms may be an emergency. Get help right away. Call your local emergency services (911 in the [place] Do not wait to see if the symptoms will go away. Do not drive yourself to the hospital. Summary An inguinal hernia is when fat or your intestines push through a weak spot in a muscle where your leg meets your lower belly (groin). This causes a bulge. If you do not have symptoms, you may not need treatment. If you have symptoms or a large hernia, you may need surgery. Avoid lifting heavy objects. Also, avoid standing for long amounts of time. Do not try to push the bulge back in if it will not go in easily. This information is not intended to replace advice given to you by your health care provider. Make sure you discuss any questions you have with your healthcare provider. Document Revised: 09/03/2019 Document Reviewed: 09/03/2019 Elsevier Patient Education  2022 [employer].
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
## 2020-08-18 NOTE — Telephone Encounter (Signed)
Error

## 2020-09-07 ENCOUNTER — Ambulatory Visit: Payer: Medicare (Managed Care)

## 2020-09-08 ENCOUNTER — Ambulatory Visit: Payer: Self-pay | Admitting: *Deleted

## 2020-09-08 NOTE — Telephone Encounter (Signed)
I returned pt's call.   She had called in c/o body aches first thing in the mornings.  She didn't know if it was something to be concerned about or not or was it a part of getting older.    See triage notes.  I went over some of the care advice with her.  The body aches resolve after she has been up and moving around about 30 minutes after getting up in the mornings.  I forwarded this information to [facility] for Dr. Ashley Jacobs information.

## 2020-09-08 NOTE — Telephone Encounter (Signed)
I returned pt's call.   She is c/o body aches when she first gets up and moving around.  The body aches started last week.   They don't happen every day.   "It just doesn't seem normal".   "But I don't know maybe it's just me getting older". I asked her if she was having any symptoms; coughing, runny nose, sore throat, vomiting, diarrhea, fever and she denied them all.    I told my daughter about the body aches.   "I don't know after being up for 30 minutes I feel fine".    "I just don't want to take any more medicine".    She denied starting any new exercises.   "I walk every day but that's not new".  She thanked me for returning her call and hung up.   Reason for Disposition  [1] MILD pain (e.g., does not interfere with normal activities) AND [2] present < 7 days  Answer Assessment - Initial Assessment Questions 1. ONSET: "When did the muscle aches or body pains start?"      Last week.  When I first get up in the mornings I'm having body aches that go away after being up and moving around for about 30 minutes. 2. LOCATION: "What part of your body is hurting?" (e.g., entire body, arms, legs)      All over 3. SEVERITY: "How bad is the pain?" (Scale 1-10; or mild, moderate, severe)   - MILD (1-3): doesn't interfere with normal activities    - MODERATE (4-7): interferes with normal activities or awakens from sleep    - SEVERE (8-10):  excruciating pain, unable to do any normal activities      mild 4. CAUSE: "What do you think is causing the pains?"     "I don't know".   "Maybe getting older".  "It doesn't seem normal".  5. FEVER: "Have you been having fever?"     Denies fever 6. OTHER SYMPTOMS: "Do you have any other symptoms?" (e.g., chest pain, weakness, rash, cold or flu symptoms, weight loss)     Denies any other symptoms coughing, runny nose, sore throat, vomiting, diarrhea or fever. 7. PREGNANCY: "Is there any chance you are pregnant?" "When was your last menstrual period?"     N/A  due to age 85. TRAVEL: "Have you traveled out of the country in the last month?" (e.g., travel history, exposures)     No  Protocols used: Muscle Aches and Body Pain-A-AH

## 2020-09-18 DIAGNOSIS — F0280 Dementia in other diseases classified elsewhere without behavioral disturbance: Secondary | ICD-10-CM | POA: Diagnosis not present

## 2020-09-18 DIAGNOSIS — R41841 Cognitive communication deficit: Secondary | ICD-10-CM | POA: Diagnosis not present

## 2020-09-18 DIAGNOSIS — M6281 Muscle weakness (generalized): Secondary | ICD-10-CM | POA: Diagnosis not present

## 2020-09-18 DIAGNOSIS — R279 Unspecified lack of coordination: Secondary | ICD-10-CM | POA: Diagnosis not present

## 2020-09-18 DIAGNOSIS — R488 Other symbolic dysfunctions: Secondary | ICD-10-CM | POA: Diagnosis not present

## 2020-09-18 DIAGNOSIS — F0390 Unspecified dementia without behavioral disturbance: Secondary | ICD-10-CM | POA: Diagnosis not present

## 2020-09-23 DIAGNOSIS — R41841 Cognitive communication deficit: Secondary | ICD-10-CM | POA: Diagnosis not present

## 2020-09-23 DIAGNOSIS — R488 Other symbolic dysfunctions: Secondary | ICD-10-CM | POA: Diagnosis not present

## 2020-09-23 DIAGNOSIS — M6281 Muscle weakness (generalized): Secondary | ICD-10-CM | POA: Diagnosis not present

## 2020-09-23 DIAGNOSIS — F0390 Unspecified dementia without behavioral disturbance: Secondary | ICD-10-CM | POA: Diagnosis not present

## 2020-09-23 DIAGNOSIS — R279 Unspecified lack of coordination: Secondary | ICD-10-CM | POA: Diagnosis not present

## 2020-09-23 DIAGNOSIS — F0280 Dementia in other diseases classified elsewhere without behavioral disturbance: Secondary | ICD-10-CM | POA: Diagnosis not present

## 2020-09-24 DIAGNOSIS — F0390 Unspecified dementia without behavioral disturbance: Secondary | ICD-10-CM | POA: Diagnosis not present

## 2020-09-24 DIAGNOSIS — M6281 Muscle weakness (generalized): Secondary | ICD-10-CM | POA: Diagnosis not present

## 2020-09-24 DIAGNOSIS — R41841 Cognitive communication deficit: Secondary | ICD-10-CM | POA: Diagnosis not present

## 2020-09-24 DIAGNOSIS — R488 Other symbolic dysfunctions: Secondary | ICD-10-CM | POA: Diagnosis not present

## 2020-09-24 DIAGNOSIS — R279 Unspecified lack of coordination: Secondary | ICD-10-CM | POA: Diagnosis not present

## 2020-09-24 DIAGNOSIS — F0280 Dementia in other diseases classified elsewhere without behavioral disturbance: Secondary | ICD-10-CM | POA: Diagnosis not present

## 2020-09-25 DIAGNOSIS — F0390 Unspecified dementia without behavioral disturbance: Secondary | ICD-10-CM | POA: Diagnosis not present

## 2020-09-25 DIAGNOSIS — R488 Other symbolic dysfunctions: Secondary | ICD-10-CM | POA: Diagnosis not present

## 2020-09-25 DIAGNOSIS — R41841 Cognitive communication deficit: Secondary | ICD-10-CM | POA: Diagnosis not present

## 2020-09-25 DIAGNOSIS — R279 Unspecified lack of coordination: Secondary | ICD-10-CM | POA: Diagnosis not present

## 2020-09-25 DIAGNOSIS — M6281 Muscle weakness (generalized): Secondary | ICD-10-CM | POA: Diagnosis not present

## 2020-09-25 DIAGNOSIS — F0280 Dementia in other diseases classified elsewhere without behavioral disturbance: Secondary | ICD-10-CM | POA: Diagnosis not present

## 2020-09-30 ENCOUNTER — Ambulatory Visit: Payer: Medicare HMO

## 2020-09-30 DIAGNOSIS — R41841 Cognitive communication deficit: Secondary | ICD-10-CM | POA: Diagnosis not present

## 2020-09-30 DIAGNOSIS — R279 Unspecified lack of coordination: Secondary | ICD-10-CM | POA: Diagnosis not present

## 2020-09-30 DIAGNOSIS — F0280 Dementia in other diseases classified elsewhere without behavioral disturbance: Secondary | ICD-10-CM | POA: Diagnosis not present

## 2020-09-30 DIAGNOSIS — F0390 Unspecified dementia without behavioral disturbance: Secondary | ICD-10-CM | POA: Diagnosis not present

## 2020-09-30 DIAGNOSIS — R488 Other symbolic dysfunctions: Secondary | ICD-10-CM | POA: Diagnosis not present

## 2020-09-30 DIAGNOSIS — M6281 Muscle weakness (generalized): Secondary | ICD-10-CM | POA: Diagnosis not present

## 2020-10-01 ENCOUNTER — Telehealth: Payer: Self-pay | Admitting: Internal Medicine

## 2020-10-01 NOTE — Telephone Encounter (Signed)
Copied from [place] [id number]. Topic: Medicare AWV >> Oct 01, 2020 10:05 AM Cher Nakai R wrote: Reason for CRM:  Left message for patient to call back and schedule Medicare Annual Wellness Visit (AWV) in office.   If unable to come into the office for AWV,  please offer to do virtually or by telephone.  Last AWV: 09/04/2019  Please schedule at anytime with [facility] Health Advisor.  40 minute appointment  Any questions, please contact me at [phone]

## 2020-10-05 DIAGNOSIS — R279 Unspecified lack of coordination: Secondary | ICD-10-CM | POA: Diagnosis not present

## 2020-10-05 DIAGNOSIS — F0280 Dementia in other diseases classified elsewhere without behavioral disturbance: Secondary | ICD-10-CM | POA: Diagnosis not present

## 2020-10-05 DIAGNOSIS — R41841 Cognitive communication deficit: Secondary | ICD-10-CM | POA: Diagnosis not present

## 2020-10-05 DIAGNOSIS — F0390 Unspecified dementia without behavioral disturbance: Secondary | ICD-10-CM | POA: Diagnosis not present

## 2020-10-05 DIAGNOSIS — M6281 Muscle weakness (generalized): Secondary | ICD-10-CM | POA: Diagnosis not present

## 2020-10-05 DIAGNOSIS — R488 Other symbolic dysfunctions: Secondary | ICD-10-CM | POA: Diagnosis not present

## 2020-10-06 DIAGNOSIS — R488 Other symbolic dysfunctions: Secondary | ICD-10-CM | POA: Diagnosis not present

## 2020-10-06 DIAGNOSIS — F0280 Dementia in other diseases classified elsewhere without behavioral disturbance: Secondary | ICD-10-CM | POA: Diagnosis not present

## 2020-10-06 DIAGNOSIS — R279 Unspecified lack of coordination: Secondary | ICD-10-CM | POA: Diagnosis not present

## 2020-10-06 DIAGNOSIS — R41841 Cognitive communication deficit: Secondary | ICD-10-CM | POA: Diagnosis not present

## 2020-10-06 DIAGNOSIS — F0390 Unspecified dementia without behavioral disturbance: Secondary | ICD-10-CM | POA: Diagnosis not present

## 2020-10-06 DIAGNOSIS — M6281 Muscle weakness (generalized): Secondary | ICD-10-CM | POA: Diagnosis not present

## 2020-10-08 DIAGNOSIS — F0390 Unspecified dementia without behavioral disturbance: Secondary | ICD-10-CM | POA: Diagnosis not present

## 2020-10-08 DIAGNOSIS — M6281 Muscle weakness (generalized): Secondary | ICD-10-CM | POA: Diagnosis not present

## 2020-10-08 DIAGNOSIS — R279 Unspecified lack of coordination: Secondary | ICD-10-CM | POA: Diagnosis not present

## 2020-10-08 DIAGNOSIS — R41841 Cognitive communication deficit: Secondary | ICD-10-CM | POA: Diagnosis not present

## 2020-10-08 DIAGNOSIS — F0280 Dementia in other diseases classified elsewhere without behavioral disturbance: Secondary | ICD-10-CM | POA: Diagnosis not present

## 2020-10-08 DIAGNOSIS — R488 Other symbolic dysfunctions: Secondary | ICD-10-CM | POA: Diagnosis not present

## 2020-10-12 DIAGNOSIS — R488 Other symbolic dysfunctions: Secondary | ICD-10-CM | POA: Diagnosis not present

## 2020-10-12 DIAGNOSIS — R279 Unspecified lack of coordination: Secondary | ICD-10-CM | POA: Diagnosis not present

## 2020-10-12 DIAGNOSIS — F0390 Unspecified dementia without behavioral disturbance: Secondary | ICD-10-CM | POA: Diagnosis not present

## 2020-10-12 DIAGNOSIS — M6281 Muscle weakness (generalized): Secondary | ICD-10-CM | POA: Diagnosis not present

## 2020-10-12 DIAGNOSIS — R41841 Cognitive communication deficit: Secondary | ICD-10-CM | POA: Diagnosis not present

## 2020-10-12 DIAGNOSIS — F0280 Dementia in other diseases classified elsewhere without behavioral disturbance: Secondary | ICD-10-CM | POA: Diagnosis not present

## 2020-10-13 NOTE — Telephone Encounter (Signed)
Pt stated she can't drive anymore and needs to do her Medicare Annual Wellness Visit (AWV) over the phone. Please advise.

## 2020-10-15 DIAGNOSIS — R488 Other symbolic dysfunctions: Secondary | ICD-10-CM | POA: Diagnosis not present

## 2020-10-15 DIAGNOSIS — R41841 Cognitive communication deficit: Secondary | ICD-10-CM | POA: Diagnosis not present

## 2020-10-15 DIAGNOSIS — F0280 Dementia in other diseases classified elsewhere without behavioral disturbance: Secondary | ICD-10-CM | POA: Diagnosis not present

## 2020-10-15 DIAGNOSIS — R279 Unspecified lack of coordination: Secondary | ICD-10-CM | POA: Diagnosis not present

## 2020-10-15 DIAGNOSIS — M6281 Muscle weakness (generalized): Secondary | ICD-10-CM | POA: Diagnosis not present

## 2020-10-15 DIAGNOSIS — F0390 Unspecified dementia without behavioral disturbance: Secondary | ICD-10-CM | POA: Diagnosis not present

## 2020-10-19 DIAGNOSIS — R41841 Cognitive communication deficit: Secondary | ICD-10-CM | POA: Diagnosis not present

## 2020-10-19 DIAGNOSIS — F0280 Dementia in other diseases classified elsewhere without behavioral disturbance: Secondary | ICD-10-CM | POA: Diagnosis not present

## 2020-10-19 DIAGNOSIS — R488 Other symbolic dysfunctions: Secondary | ICD-10-CM | POA: Diagnosis not present

## 2020-10-20 ENCOUNTER — Telehealth: Payer: Self-pay

## 2020-10-20 DIAGNOSIS — R41841 Cognitive communication deficit: Secondary | ICD-10-CM | POA: Diagnosis not present

## 2020-10-20 DIAGNOSIS — R488 Other symbolic dysfunctions: Secondary | ICD-10-CM | POA: Diagnosis not present

## 2020-10-20 DIAGNOSIS — F0280 Dementia in other diseases classified elsewhere without behavioral disturbance: Secondary | ICD-10-CM | POA: Diagnosis not present

## 2020-10-20 NOTE — Telephone Encounter (Signed)
Copied from [place] [id number]. Topic: General - Inquiry >> Oct 19, 2020  4:38 PM Loma Boston wrote: Reason for CRM: Pls FU with this pt as had requested her AWV be over the phone, states she is losing her memory but wants appt on 11/02/20 to be a virtual appt. She is not getting out much. She apologies if she has already been told but pls call [phone] and confirm for her this will be a phone visit. She does not have much coice but phone.  Patient confirmed telephone visit for AWV.

## 2020-10-22 DIAGNOSIS — R41841 Cognitive communication deficit: Secondary | ICD-10-CM | POA: Diagnosis not present

## 2020-10-22 DIAGNOSIS — F0280 Dementia in other diseases classified elsewhere without behavioral disturbance: Secondary | ICD-10-CM | POA: Diagnosis not present

## 2020-10-22 DIAGNOSIS — R488 Other symbolic dysfunctions: Secondary | ICD-10-CM | POA: Diagnosis not present

## 2020-10-26 DIAGNOSIS — R41841 Cognitive communication deficit: Secondary | ICD-10-CM | POA: Diagnosis not present

## 2020-10-26 DIAGNOSIS — F0280 Dementia in other diseases classified elsewhere without behavioral disturbance: Secondary | ICD-10-CM | POA: Diagnosis not present

## 2020-10-26 DIAGNOSIS — R488 Other symbolic dysfunctions: Secondary | ICD-10-CM | POA: Diagnosis not present

## 2020-10-27 DIAGNOSIS — R488 Other symbolic dysfunctions: Secondary | ICD-10-CM | POA: Diagnosis not present

## 2020-10-27 DIAGNOSIS — F0280 Dementia in other diseases classified elsewhere without behavioral disturbance: Secondary | ICD-10-CM | POA: Diagnosis not present

## 2020-10-27 DIAGNOSIS — R41841 Cognitive communication deficit: Secondary | ICD-10-CM | POA: Diagnosis not present

## 2020-10-29 NOTE — Telephone Encounter (Signed)
Called pt could not leave VM. Mailbox was full.  [facility] nurse may give results to patient if they return call to clinic, a CRM has been created.  KP

## 2020-10-29 NOTE — Telephone Encounter (Signed)
Pt is calling back - she sated that she missed a call. Please advise CB- [phone]

## 2020-10-29 NOTE — Telephone Encounter (Signed)
Per appointment desk, the appointment for Evelyn Clements has been changed to a Telephone visit on 11/02/20, notify patient if she calls back.

## 2020-11-02 ENCOUNTER — Ambulatory Visit: Payer: Medicare HMO

## 2020-11-02 DIAGNOSIS — F0280 Dementia in other diseases classified elsewhere without behavioral disturbance: Secondary | ICD-10-CM | POA: Diagnosis not present

## 2020-11-02 DIAGNOSIS — R488 Other symbolic dysfunctions: Secondary | ICD-10-CM | POA: Diagnosis not present

## 2020-11-02 DIAGNOSIS — R41841 Cognitive communication deficit: Secondary | ICD-10-CM | POA: Diagnosis not present

## 2020-11-04 DIAGNOSIS — R41841 Cognitive communication deficit: Secondary | ICD-10-CM | POA: Diagnosis not present

## 2020-11-04 DIAGNOSIS — F0280 Dementia in other diseases classified elsewhere without behavioral disturbance: Secondary | ICD-10-CM | POA: Diagnosis not present

## 2020-11-04 DIAGNOSIS — R488 Other symbolic dysfunctions: Secondary | ICD-10-CM | POA: Diagnosis not present

## 2020-11-05 DIAGNOSIS — F0280 Dementia in other diseases classified elsewhere without behavioral disturbance: Secondary | ICD-10-CM | POA: Diagnosis not present

## 2020-11-05 DIAGNOSIS — R488 Other symbolic dysfunctions: Secondary | ICD-10-CM | POA: Diagnosis not present

## 2020-11-05 DIAGNOSIS — R41841 Cognitive communication deficit: Secondary | ICD-10-CM | POA: Diagnosis not present

## 2020-11-09 DIAGNOSIS — R488 Other symbolic dysfunctions: Secondary | ICD-10-CM | POA: Diagnosis not present

## 2020-11-09 DIAGNOSIS — F0280 Dementia in other diseases classified elsewhere without behavioral disturbance: Secondary | ICD-10-CM | POA: Diagnosis not present

## 2020-11-09 DIAGNOSIS — R41841 Cognitive communication deficit: Secondary | ICD-10-CM | POA: Diagnosis not present

## 2020-12-14 ENCOUNTER — Other Ambulatory Visit: Payer: Self-pay | Admitting: Internal Medicine

## 2020-12-14 DIAGNOSIS — I10 Essential (primary) hypertension: Secondary | ICD-10-CM

## 2020-12-14 NOTE — Telephone Encounter (Signed)
Medication Refill - Medication: hydrochlorothiazide (HYDRODIURIL) 12.5 MG tablet   sertraline (ZOLOFT) 50 MG tablet  diltiazem (CARDIZEM CD) 120 MG 24 hr capsule    Has the patient contacted their pharmacy? No. (Agent: If no, request that the patient contact the pharmacy for the refill. If patient does not wish to contact the pharmacy document the reason why and proceed with request.) (Agent: If yes, when and what did the pharmacy advise?) Pts [facility] closed [facility] and they would now like meds sent to [facility] in [place]/ had to call pcp for this reason   Preferred Pharmacy (with phone number or street name):  [id number] - [place], [place] - [address] Phone:  [phone]  Fax:  [phone]     Has the patient been seen for an appointment in the last year OR does the patient have an upcoming appointment? Yes.    Agent: Please be advised that RX refills may take up to 3 business days. We ask that you follow-up with your pharmacy.

## 2020-12-15 MED ORDER — HYDROCHLOROTHIAZIDE 12.5 MG PO TABS: 12.5000 mg | ORAL_TABLET | Freq: Every day | ORAL | 0 refills | Status: DC

## 2020-12-15 NOTE — Telephone Encounter (Signed)
Please review.  KP

## 2020-12-15 NOTE — Telephone Encounter (Signed)
Requested medications are on the active medication list signature not present  Last visit 06/29/20  Future visit scheduled 01/06/21  Notes to clinic Historical Provider, please assess. Requested Prescriptions  Pending Prescriptions Disp Refills   diltiazem (CARDIZEM CD) 120 MG 24 hr capsule       Cardiovascular:  Calcium Channel Blockers Failed - 12/14/2020  7:54 PM      Failed - Last BP in normal range    BP Readings from Last 1 Encounters:  08/17/20 (!) 151/50          Passed - Valid encounter within last 6 months    Recent Outpatient Visits           5 months ago Inguinal hernia of right side without obstruction or gangrene   [facility] Glean Hess, MD   5 months ago Essential (primary) hypertension   [facility] Glean Hess, MD   11 months ago Annual physical exam   [facility] Glean Hess, MD   1 year ago Hernia of abdominal wall   [facility] Glean Hess, MD   1 year ago Essential (primary) hypertension   [place], [place]       Future Appointments             In 1 month Glean Hess, MD [facility]             sertraline (ZOLOFT) 50 MG tablet      Sig: TAKE 1/2 TAB AT BEDTIME FOR 2 WEEKS, THEN 1 TAB AT BEDTIME     Psychiatry:  Antidepressants - SSRI Passed - 12/14/2020  7:54 PM      Passed - Valid encounter within last 6 months    Recent Outpatient Visits           5 months ago Inguinal hernia of right side without obstruction or gangrene   [facility] Glean Hess, MD   5 months ago Essential (primary) hypertension   [facility] Glean Hess, MD   11 months ago Annual physical exam   [facility] Glean Hess, MD   1 year ago Hernia of abdominal wall   [facility] Glean Hess, MD   1 year ago Essential (primary) hypertension   [facility] Glean Hess, MD        Future Appointments             In 1 month Glean Hess, MD [facility]            Signed Prescriptions Disp Refills   hydrochlorothiazide (HYDRODIURIL) 12.5 MG tablet 90 tablet 0    Sig: Take 1 tablet (12.5 mg total) by mouth daily.     Cardiovascular: Diuretics - Thiazide Failed - 12/14/2020  7:54 PM      Failed - Cr in normal range and within 360 days    Creatinine, Ser  Date Value Ref Range Status  01/20/2020 1.04 (H) 0.44 - 1.00 mg/dL Final          Failed - Last BP in normal range    BP Readings from Last 1 Encounters:  08/17/20 (!) 151/50          Passed - Ca in normal range and within 360 days    Calcium  Date Value Ref Range Status  01/20/2020 9.1 8.9 - 10.3 mg/dL Final  Passed - K in normal range and within 360 days    Potassium  Date Value Ref Range Status  01/20/2020 3.5 3.5 - 5.1 mmol/L Final          Passed - Na in normal range and within 360 days    Sodium  Date Value Ref Range Status  01/20/2020 139 135 - 145 mmol/L Final  01/16/2019 142 134 - 144 mmol/L Final          Passed - Valid encounter within last 6 months    Recent Outpatient Visits           5 months ago Inguinal hernia of right side without obstruction or gangrene   [facility] Glean Hess, MD   5 months ago Essential (primary) hypertension   [facility] Glean Hess, MD   11 months ago Annual physical exam   [facility] Glean Hess, MD   1 year ago Hernia of abdominal wall   [facility] Glean Hess, MD   1 year ago Essential (primary) hypertension   [facility] Glean Hess, MD       Future Appointments             In 1 month Army Melia, Jesse Sans, MD [facility], [facility]

## 2020-12-15 NOTE — Telephone Encounter (Signed)
Requested Prescriptions  Pending Prescriptions Disp Refills  . hydrochlorothiazide (HYDRODIURIL) 12.5 MG tablet 90 tablet 0    Sig: Take 1 tablet (12.5 mg total) by mouth daily.     Cardiovascular: Diuretics - Thiazide Failed - 12/14/2020  7:54 PM      Failed - Cr in normal range and within 360 days    Creatinine, Ser  Date Value Ref Range Status  01/20/2020 1.04 (H) 0.44 - 1.00 mg/dL Final         Failed - Last BP in normal range    BP Readings from Last 1 Encounters:  08/17/20 (!) 151/50         Passed - Ca in normal range and within 360 days    Calcium  Date Value Ref Range Status  01/20/2020 9.1 8.9 - 10.3 mg/dL Final         Passed - K in normal range and within 360 days    Potassium  Date Value Ref Range Status  01/20/2020 3.5 3.5 - 5.1 mmol/L Final         Passed - Na in normal range and within 360 days    Sodium  Date Value Ref Range Status  01/20/2020 139 135 - 145 mmol/L Final  01/16/2019 142 134 - 144 mmol/L Final         Passed - Valid encounter within last 6 months    Recent Outpatient Visits          5 months ago Inguinal hernia of right side without obstruction or gangrene   [facility] Glean Hess, MD   5 months ago Essential (primary) hypertension   [facility] Glean Hess, MD   11 months ago Annual physical exam   [facility] Glean Hess, MD   1 year ago Hernia of abdominal wall   [facility] Glean Hess, MD   1 year ago Essential (primary) hypertension   [place], Laura H, MD      Future Appointments            In 1 month Glean Hess, MD [facility]           . diltiazem (CARDIZEM CD) 120 MG 24 hr capsule       Cardiovascular:  Calcium Channel Blockers Failed - 12/14/2020  7:54 PM      Failed - Last BP in normal range    BP Readings from Last 1 Encounters:  08/17/20 (!) 151/50         Passed - Valid encounter within last 6  months    Recent Outpatient Visits          5 months ago Inguinal hernia of right side without obstruction or gangrene   [facility] Glean Hess, MD   5 months ago Essential (primary) hypertension   [facility] Glean Hess, MD   11 months ago Annual physical exam   [facility] Glean Hess, MD   1 year ago Hernia of abdominal wall   [facility] Glean Hess, MD   1 year ago Essential (primary) hypertension   [facility] Glean Hess, MD      Future Appointments            In 1 month Army Melia Jesse Sans, MD [facility] [place]           . sertraline (ZOLOFT) 50 MG  tablet      Sig: TAKE 1/2 TAB AT BEDTIME FOR 2 WEEKS, THEN 1 TAB AT BEDTIME     Psychiatry:  Antidepressants - SSRI Passed - 12/14/2020  7:54 PM      Passed - Valid encounter within last 6 months    Recent Outpatient Visits          5 months ago Inguinal hernia of right side without obstruction or gangrene   [facility] Glean Hess, MD   5 months ago Essential (primary) hypertension   [facility] Glean Hess, MD   11 months ago Annual physical exam   [facility] Glean Hess, MD   1 year ago Hernia of abdominal wall   [facility] Glean Hess, MD   1 year ago Essential (primary) hypertension   [facility] Glean Hess, MD      Future Appointments            In 1 month Army Melia, Jesse Sans, MD [facility], [facility]

## 2021-01-01 ENCOUNTER — Encounter: Payer: Self-pay | Admitting: Emergency Medicine

## 2021-01-01 ENCOUNTER — Emergency Department
Admission: EM | Admit: 2021-01-01 | Discharge: 2021-01-01 | Disposition: A | Discharge status: Home / Self Care | Payer: Medicare HMO | Attending: Emergency Medicine | Admitting: Emergency Medicine

## 2021-01-01 ENCOUNTER — Other Ambulatory Visit: Payer: Self-pay

## 2021-01-01 ENCOUNTER — Emergency Department: Payer: Medicare HMO

## 2021-01-01 DIAGNOSIS — R42 Dizziness and giddiness: Secondary | ICD-10-CM | POA: Insufficient documentation

## 2021-01-01 DIAGNOSIS — R001 Bradycardia, unspecified: Secondary | ICD-10-CM | POA: Diagnosis not present

## 2021-01-01 DIAGNOSIS — Z79899 Other long term (current) drug therapy: Secondary | ICD-10-CM | POA: Insufficient documentation

## 2021-01-01 DIAGNOSIS — Z853 Personal history of malignant neoplasm of breast: Secondary | ICD-10-CM | POA: Diagnosis not present

## 2021-01-01 DIAGNOSIS — G319 Degenerative disease of nervous system, unspecified: Secondary | ICD-10-CM | POA: Diagnosis not present

## 2021-01-01 DIAGNOSIS — I10 Essential (primary) hypertension: Secondary | ICD-10-CM | POA: Insufficient documentation

## 2021-01-01 LAB — BASIC METABOLIC PANEL WITH GFR
Anion gap: 5 (ref 5–15)
BUN: 20 mg/dL (ref 8–23)
CO2: 29 mmol/L (ref 22–32)
Calcium: 9.3 mg/dL (ref 8.9–10.3)
Chloride: 105 mmol/L (ref 98–111)
Creatinine, Ser: 0.76 mg/dL (ref 0.44–1.00)
GFR, Estimated: >60 mL/min
Glucose, Bld: 146 mg/dL — ABNORMAL HIGH (ref 70–99)
Potassium: 3.5 mmol/L (ref 3.5–5.1)
Sodium: 139 mmol/L (ref 135–145)

## 2021-01-01 LAB — CBC
HCT: 43.1 % (ref 36.0–46.0)
Hemoglobin: 14.1 g/dL (ref 12.0–15.0)
MCH: 29.8 pg (ref 26.0–34.0)
MCHC: 32.7 g/dL (ref 30.0–36.0)
MCV: 91.1 fL (ref 80.0–100.0)
Platelets: 203 10*3/uL (ref 150–400)
RBC: 4.73 MIL/uL (ref 3.87–5.11)
RDW: 13.9 % (ref 11.5–15.5)
WBC: 8.1 10*3/uL (ref 4.0–10.5)
nRBC: 0 % (ref 0.0–0.2)

## 2021-01-01 LAB — TROPONIN I (HIGH SENSITIVITY): Troponin I (High Sensitivity): 13 ng/L (ref <18)

## 2021-01-01 MED ORDER — CLONIDINE HCL 0.1 MG PO TABS
0.1000 mg | ORAL_TABLET | Freq: Once | ORAL | Status: AC
  Administered 2021-01-01: 0.1 mg via ORAL
  Filled 2021-01-01: qty 1

## 2021-01-01 MED ORDER — LORAZEPAM 0.5 MG PO TABS
0.5000 mg | ORAL_TABLET | Freq: Once | ORAL | Status: AC
  Administered 2021-01-01: 0.5 mg via ORAL
  Filled 2021-01-01: qty 1

## 2021-01-01 MED ORDER — MECLIZINE HCL 25 MG PO TABS: 25.0000 mg | ORAL_TABLET | Freq: Two times a day (BID) | ORAL | 0 refills | Status: DC | PRN

## 2021-01-01 NOTE — ED Provider Notes (Signed)
----------------------------------------- °  8:41 PM on 01/01/2021 ----------------------------------------- Patient's MR brain is negative for acute abnormality.  Reassuring work-up.  We will discharge patient home with a short course of meclizine to be used as needed.  Patient will follow-up with her neurologist.  Patient agreeable to plan of care.   Harvest Dark, MD 01/01/21 2041

## 2021-01-01 NOTE — ED Notes (Signed)
Pt transported to MRI 

## 2021-01-01 NOTE — ED Notes (Signed)
Pt's daughter returned, states that UC sent her back due to her BP being elevated. Pt placed back on the board at this time.

## 2021-01-01 NOTE — ED Triage Notes (Signed)
Pt via EMS from [facility]. Pt states she woke up feel dizzy. No dizziness at the moment. Denies headache. Per EMS, pt is hypertensive with 548P systolic. Pt is A&OX4 and NAD, but does have a medical hx of Alzheimer Disease.

## 2021-01-01 NOTE — ED Provider Notes (Signed)
[facility] Emergency Department Provider Note  ____________________________________________   Event Date/Time   First MD Initiated Contact with Patient 01/01/21 1239     (approximate)  I have reviewed the triage vital signs and the nursing notes.   HISTORY  Chief Complaint Hypertension and Dizziness    HPI Evelyn Clements is a 85 y.o. female  with PMHx HTN, HLD, mild cognitive impairment, here with dizziness. Pt reportedly developed mild dizziness starting yesterday. She had been out of her BP meds for "a few weeks" and just restarted them several days ago. Thought it was related to this. She c/o dizziness throughout the day today so daughter brought her for evaluation. Pt has been able to ambulate today but seemed "a bit unsteady." She denies ongoing dizziness, vision changes. No apparent changes in speech. Denies focal numbness, weakness. No recent medication changes. Denies any tinnitus, aural fullness, sinus sx. No h/o vertigo.         Past Medical History:  Diagnosis Date   Breast cancer ([place]) 1980   right breast ca   GERD (gastroesophageal reflux disease)    Hypercholesteremia    Hypertension    Mild cognitive impairment    seen by neurology    Patient Active Problem List   Diagnosis Date Noted   Aortic atherosclerosis ([place]) 01/20/2020   Non-recurrent bilateral inguinal hernia without obstruction or gangrene 10/22/2019   Mild cognitive impairment 09/21/2018   RBC microcytosis 09/21/2018   DDD (degenerative disc disease), lumbar 05/07/2018   Spondylolisthesis of lumbar region 05/07/2018   Dermoid cyst of scalp 03/16/2018   Eczema 12/18/2017   Chronic bilateral low back pain without sciatica 06/22/2016   Hiatal hernia    Sebaceous cyst of labia 06/01/2015   Epidermal inclusion cyst 06/01/2015   Barrett esophagus 06/21/2014   Essential (primary) hypertension 06/21/2014   Fibrocystic breast 06/21/2014   Insomnia, persistent 06/21/2014    Hyperlipidemia, mixed 06/21/2014   OP (osteoporosis) 06/21/2014   Arthritis of knee, degenerative 06/21/2014    Past Surgical History:  Procedure Laterality Date   ABDOMINAL HYSTERECTOMY  1990   Total   APPENDECTOMY     BREAST EXCISIONAL BIOPSY Right 1980's   lumpectomy lobular ca pt stated no chemo no rad   BUNIONECTOMY Right    CATARACT EXTRACTION W/ INTRAOCULAR LENS  IMPLANT, BILATERAL     CESAREAN SECTION     ESOPHAGOGASTRODUODENOSCOPY  2013   Barrett's esophagus   ESOPHAGOGASTRODUODENOSCOPY (EGD) WITH PROPOFOL N/A 12/14/2015   Procedure: ESOPHAGOGASTRODUODENOSCOPY (EGD) WITH PROPOFOL;  Surgeon: Lucilla Lame, MD;  Location: [facility];  Service: Endoscopy;  Laterality: N/A;   TONSILLECTOMY      Prior to Admission medications   Medication Sig Start Date End Date Taking? Authorizing Provider  Bioflavonoid Products (VITAMIN C) CHEW Chew 1 tablet by mouth daily.    [provider]  Calcium Carb-Cholecalciferol (CALCIUM + D3) 600-200 MG-UNIT TABS Take 1 tablet by mouth daily.    [provider]  diltiazem (CARDIZEM CD) 120 MG 24 hr capsule  01/21/20   [provider]  donepezil (ARICEPT) 5 MG tablet Take 5 mg by mouth daily. 12/26/20   [provider]  hydrochlorothiazide (HYDRODIURIL) 12.5 MG tablet Take 1 tablet (12.5 mg total) by mouth daily. 12/15/20   Glean Hess, MD  sertraline (ZOLOFT) 50 MG tablet TAKE 1/2 TAB AT BEDTIME FOR 2 WEEKS, THEN 1 TAB AT BEDTIME 11/18/18   [provider]    Allergies Zolpidem tartrate  Family History  Problem Relation Age of Onset   Leukemia Father    Uterine cancer Mother    Breast cancer Neg Hx     Social History Social History   Tobacco Use   Smoking status: Never   Smokeless tobacco: Never  Vaping Use   Vaping Use: Never used  Substance Use Topics   Alcohol use: No    Alcohol/week: 0.0 standard drinks   Drug use: No    Review of Systems  Review of Systems   Constitutional:  Positive for fatigue. Negative for chills and fever.  HENT:  Negative for sore throat.   Respiratory:  Negative for shortness of breath.   Cardiovascular:  Negative for chest pain.  Gastrointestinal:  Negative for abdominal pain.  Genitourinary:  Negative for flank pain.  Musculoskeletal:  Negative for neck pain.  Skin:  Negative for rash and wound.  Allergic/Immunologic: Negative for immunocompromised state.  Neurological:  Positive for dizziness. Negative for weakness and numbness.  Hematological:  Does not bruise/bleed easily.  All other systems reviewed and are negative.   ____________________________________________  PHYSICAL EXAM:      VITAL SIGNS: ED Triage Vitals  Enc Vitals Group     BP 01/01/21 0911 (!) 169/74     Pulse Rate 01/01/21 0911 (!) 59     Resp 01/01/21 0911 20     Temp 01/01/21 0911 98.1 F (36.7 C)     Temp Source 01/01/21 0911 Oral     SpO2 01/01/21 0911 95 %     Weight 01/01/21 0913 160 lb (72.6 kg)     Height 01/01/21 0913 4\' 11"  (1.499 m)     Head Circumference --      Peak Flow --      Pain Score 01/01/21 0912 0     Pain Loc --      Pain Edu? --      Excl. in [place]? --      Physical Exam Vitals and nursing note reviewed.  Constitutional:      General: She is not in acute distress.    Appearance: She is well-developed.  HENT:     Head: Normocephalic and atraumatic.  Eyes:     Conjunctiva/sclera: Conjunctivae normal.  Cardiovascular:     Rate and Rhythm: Normal rate and regular rhythm.     Heart sounds: Normal heart sounds.  Pulmonary:     Effort: Pulmonary effort is normal. No respiratory distress.     Breath sounds: No wheezing.  Abdominal:     General: There is no distension.  Musculoskeletal:     Cervical back: Neck supple.  Skin:    General: Skin is warm.     Capillary Refill: Capillary refill takes less than 2 seconds.     Findings: No rash.  Neurological:     Mental Status: She is alert and oriented to  person, place, and time.     Motor: No abnormal muscle tone.     Comments: Neurological Exam:  Mental Status: Alert and oriented to person, place, and time. Attention and concentration normal. Speech clear. Recent memory is intact. Cranial Nerves: Visual fields grossly intact. EOMI and PERRLA. No nystagmus noted. Facial sensation intact at forehead, maxillary cheek, and chin/mandible bilaterally. No facial asymmetry or weakness. Hearing grossly normal. Uvula is midline, and palate elevates symmetrically. Normal SCM and trapezius strength. Tongue midline without fasciculations. Motor: Muscle strength 5/5 in proximal and distal UE and LE bilaterally. No pronator drift. Muscle tone normal. Sensation: Intact to light touch  in upper and lower extremities distally bilaterally.  Gait: Deferred Coordination: Normal FTN bilaterally.         ____________________________________________   LABS (all labs ordered are listed, but only abnormal results are displayed)  Labs Reviewed  BASIC METABOLIC PANEL - Abnormal; Notable for the following components:      Result Value   Glucose, Bld 146 (*)    All other components within normal limits  CBC  URINALYSIS, ROUTINE W REFLEX MICROSCOPIC  CBG MONITORING, ED  TROPONIN I (HIGH SENSITIVITY)    ____________________________________________  EKG: Sinus bradycardia, VR 58. PR 168, QRS 82, QTc 433. No acute St elevations or depressions. No ischemia or infarct. ________________________________________  RADIOLOGY All imaging, including plain films, CT scans, and ultrasounds, independently reviewed by me, and interpretations confirmed via formal radiology reads.  ED MD interpretation:   MR Brain : Pending  Official radiology report(s): No results found.  ____________________________________________  PROCEDURES   Procedure(s) performed (including Critical Care):  Procedures  ____________________________________________  INITIAL IMPRESSION /  MDM / [place] / ED COURSE  As part of my medical decision making, I reviewed the following data within the [facility] notes reviewed and incorporated, Old chart reviewed, Notes from prior ED visits, and [place] Controlled Substance Database       *Tajanae Guilbault was evaluated in Emergency Department on 01/01/2021 for the symptoms described in the history of present illness. She was evaluated in the context of the global COVID-19 pandemic, which necessitated consideration that the patient might be at risk for infection with the SARS-CoV-2 virus that causes COVID-19. Institutional protocols and algorithms that pertain to the evaluation of patients at risk for COVID-19 are in a state of rapid change based on information released by regulatory bodies including the CDC and federal and state organizations. These policies and algorithms were followed during the patient's care in the ED.  Some ED evaluations and interventions may be delayed as a result of limited staffing during the pandemic.*     Medical Decision Making: 85 year old female here with mild dizziness.  Differential is broad and includes peripheral vertigo, central vertigo, symptomatic hypertension.  Patient symptoms are resolved now.  Neurological exam including full cerebellar testing is unremarkable at this time.  No evidence of nystagmus or dysmetria.  EKG shows normal sinus rhythm.  CBC and CMP unremarkable.  Troponin negative.  Given her age, will obtain MRI for evaluation of possible central etiology.  If negative, would advise to continue adherence with her antihypertensive regimen and outpatient follow-up.   ____________________________________________  FINAL CLINICAL IMPRESSION(S) / ED DIAGNOSES  Final diagnoses:  Dizziness     MEDICATIONS GIVEN DURING THIS VISIT:  Medications  LORazepam (ATIVAN) tablet 0.5 mg (has no administration in time range)     ED Discharge Orders     None         Note:  This document was prepared using Dragon voice recognition software and may include unintentional dictation errors.   Duffy Bruce, MD 01/01/21 [phone]

## 2021-01-01 NOTE — ED Notes (Signed)
Pt's daughter to desk, states she is POA, states she feels like patient's complaints are an UC matter and that she is taking patient home. This RN explained unable to advise if safe to take patient home, pt's daughter states understanding at this time, states she is going to take her home.

## 2021-01-01 NOTE — ED Notes (Signed)
Pt visualized ambulatory with steady gait around the waiting room with her daughter, pt visualized in NAD at this time.

## 2021-01-06 ENCOUNTER — Ambulatory Visit (INDEPENDENT_AMBULATORY_CARE_PROVIDER_SITE_OTHER): Payer: Medicare HMO

## 2021-01-06 DIAGNOSIS — Z0000 Encounter for general adult medical examination without abnormal findings: Secondary | ICD-10-CM

## 2021-01-06 NOTE — Patient Instructions (Signed)
Evelyn Clements , Thank you for taking time to come for your Medicare Wellness Visit. I appreciate your ongoing commitment to your health goals. Please review the following plan we discussed and let me know if I can assist you in the future.   Screening recommendations/referrals: Colonoscopy: no longer required Mammogram: done 05/21/19 Bone Density: done 10/23/18 Recommended yearly ophthalmology/optometry visit for glaucoma screening and checkup Recommended yearly dental visit for hygiene and checkup  Vaccinations: Influenza vaccine: done 10/06/20 Pneumococcal vaccine: done 06/06/14 Tdap vaccine: due Shingles vaccine: done 09/05/19 due for second dose Covid-19:done 02/18/19 & 03/11/19  Conditions/risks identified: Recommend increasing physical aciivity as tolerated  Next appointment: Follow up in one year for your annual wellness visit    Preventive Care 48 Years and Older, Female Preventive care refers to lifestyle choices and visits with your health care provider that can promote health and wellness. What does preventive care include? A yearly physical exam. This is also called an annual well check. Dental exams once or twice a year. Routine eye exams. Ask your health care provider how often you should have your eyes checked. Personal lifestyle choices, including: Daily care of your teeth and gums. Regular physical activity. Eating a healthy diet. Avoiding tobacco and drug use. Limiting alcohol use. Practicing safe sex. Taking low-dose aspirin every day. Taking vitamin and mineral supplements as recommended by your health care provider. What happens during an annual well check? The services and screenings done by your health care provider during your annual well check will depend on your age, overall health, lifestyle risk factors, and family history of disease. Counseling  Your health care provider may ask you questions about your: Alcohol use. Tobacco use. Drug use. Emotional  well-being. Home and relationship well-being. Sexual activity. Eating habits. History of falls. Memory and ability to understand (cognition). Work and work Statistician. Reproductive health. Screening  You may have the following tests or measurements: Height, weight, and BMI. Blood pressure. Lipid and cholesterol levels. These may be checked every 5 years, or more frequently if you are over 69 years old. Skin check. Lung cancer screening. You may have this screening every year starting at age 52 if you have a 30-pack-year history of smoking and currently smoke or have quit within the past 15 years. Fecal occult blood test (FOBT) of the stool. You may have this test every year starting at age 67. Flexible sigmoidoscopy or colonoscopy. You may have a sigmoidoscopy every 5 years or a colonoscopy every 10 years starting at age 71. Hepatitis C blood test. Hepatitis B blood test. Sexually transmitted disease (STD) testing. Diabetes screening. This is done by checking your blood sugar (glucose) after you have not eaten for a while (fasting). You may have this done every 1-3 years. Bone density scan. This is done to screen for osteoporosis. You may have this done starting at age 29. Mammogram. This may be done every 1-2 years. Talk to your health care provider about how often you should have regular mammograms. Talk with your health care provider about your test results, treatment options, and if necessary, the need for more tests. Vaccines  Your health care provider may recommend certain vaccines, such as: Influenza vaccine. This is recommended every year. Tetanus, diphtheria, and acellular pertussis (Tdap, Td) vaccine. You may need a Td booster every 10 years. Zoster vaccine. You may need this after age 24. Pneumococcal 13-valent conjugate (PCV13) vaccine. One dose is recommended after age 30. Pneumococcal polysaccharide (PPSV23) vaccine. One dose is recommended after age  103. Talk to your  health care provider about which screenings and vaccines you need and how often you need them. This information is not intended to replace advice given to you by your health care provider. Make sure you discuss any questions you have with your health care provider. Document Released: 01/30/2015 Document Revised: 09/23/2015 Document Reviewed: 11/04/2014 Elsevier Interactive Patient Education  2017 [place] Prevention in the Home Falls can cause injuries. They can happen to people of all ages. There are many things you can do to make your home safe and to help prevent falls. What can I do on the outside of my home? Regularly fix the edges of walkways and driveways and fix any cracks. Remove anything that might make you trip as you walk through a door, such as a raised step or threshold. Trim any bushes or trees on the path to your home. Use bright outdoor lighting. Clear any walking paths of anything that might make someone trip, such as rocks or tools. Regularly check to see if handrails are loose or broken. Make sure that both sides of any steps have handrails. Any raised decks and porches should have guardrails on the edges. Have any leaves, snow, or ice cleared regularly. Use sand or salt on walking paths during winter. Clean up any spills in your garage right away. This includes oil or grease spills. What can I do in the bathroom? Use night lights. Install grab bars by the toilet and in the tub and shower. Do not use towel bars as grab bars. Use non-skid mats or decals in the tub or shower. If you need to sit down in the shower, use a plastic, non-slip stool. Keep the floor dry. Clean up any water that spills on the floor as soon as it happens. Remove soap buildup in the tub or shower regularly. Attach bath mats securely with double-sided non-slip rug tape. Do not have throw rugs and other things on the floor that can make you trip. What can I do in the bedroom? Use night  lights. Make sure that you have a light by your bed that is easy to reach. Do not use any sheets or blankets that are too big for your bed. They should not hang down onto the floor. Have a firm chair that has side arms. You can use this for support while you get dressed. Do not have throw rugs and other things on the floor that can make you trip. What can I do in the kitchen? Clean up any spills right away. Avoid walking on wet floors. Keep items that you use a lot in easy-to-reach places. If you need to reach something above you, use a strong step stool that has a grab bar. Keep electrical cords out of the way. Do not use floor polish or wax that makes floors slippery. If you must use wax, use non-skid floor wax. Do not have throw rugs and other things on the floor that can make you trip. What can I do with my stairs? Do not leave any items on the stairs. Make sure that there are handrails on both sides of the stairs and use them. Fix handrails that are broken or loose. Make sure that handrails are as long as the stairways. Check any carpeting to make sure that it is firmly attached to the stairs. Fix any carpet that is loose or worn. Avoid having throw rugs at the top or bottom of the stairs. If you do have  throw rugs, attach them to the floor with carpet tape. Make sure that you have a light switch at the top of the stairs and the bottom of the stairs. If you do not have them, ask someone to add them for you. What else can I do to help prevent falls? Wear shoes that: Do not have high heels. Have rubber bottoms. Are comfortable and fit you well. Are closed at the toe. Do not wear sandals. If you use a stepladder: Make sure that it is fully opened. Do not climb a closed stepladder. Make sure that both sides of the stepladder are locked into place. Ask someone to hold it for you, if possible. Clearly mark and make sure that you can see: Any grab bars or handrails. First and last  steps. Where the edge of each step is. Use tools that help you move around (mobility aids) if they are needed. These include: Canes. Walkers. Scooters. Crutches. Turn on the lights when you go into a dark area. Replace any light bulbs as soon as they burn out. Set up your furniture so you have a clear path. Avoid moving your furniture around. If any of your floors are uneven, fix them. If there are any pets around you, be aware of where they are. Review your medicines with your doctor. Some medicines can make you feel dizzy. This can increase your chance of falling. Ask your doctor what other things that you can do to help prevent falls. This information is not intended to replace advice given to you by your health care provider. Make sure you discuss any questions you have with your health care provider. Document Released: 10/30/2008 Document Revised: 06/11/2015 Document Reviewed: 02/07/2014 Elsevier Interactive Patient Education  2017 [employer].

## 2021-01-06 NOTE — Progress Notes (Signed)
Subjective:   Evelyn Clements is a 85 y.o. female who presents for Medicare Annual (Subsequent) preventive examination.  Virtual Visit via Telephone Note  I connected with  Evelyn Clements on 01/06/21 at  9:20 AM EST by telephone and verified that I am speaking with the correct person using two identifiers.  Location: Patient: home Provider: [facility] Persons participating in the virtual visit: patient & daughter [name] Health Advisor   I discussed the limitations, risks, security and privacy concerns of performing an evaluation and management service by telephone and the availability of in person appointments. The patient expressed understanding and agreed to proceed.  Interactive audio and video telecommunications were attempted between this nurse and patient, however failed, due to patient having technical difficulties OR patient did not have access to video capability.  We continued and completed visit with audio only.  Some vital signs may be absent or patient reported.   [name], LPN   Review of Systems     Cardiac Risk Factors include: advanced age (>65men, >31 women);hypertension;obesity (BMI >30kg/m2)     Objective:    There were no vitals filed for this visit. There is no height or weight on file to calculate BMI.  Advanced Directives 01/06/2021 01/01/2021 09/04/2019 08/29/2018 11/17/2016 12/14/2015 10/09/2015  Does Patient Have a Medical Advance Directive? Yes Unable to assess, patient is non-responsive or altered mental status Yes Yes Yes No Yes  Type of Advance Directive [place];Living will - [place];Living will [place];Living will [place];Living will - -  Copy of [place] in Chart? Yes - validated most recent copy scanned in chart (See row information) - Yes - validated most recent copy scanned in chart (See row information) Yes - validated most recent copy scanned in  chart (See row information) No - copy requested - -  Would patient like information on creating a medical advance directive? - - - - - No - Patient declined -    Current Medications (verified) Outpatient Encounter Medications as of 01/06/2021  Medication Sig   Calcium Carb-Cholecalciferol (CALCIUM + D3) 600-200 MG-UNIT TABS Take 1 tablet by mouth daily.   hydrochlorothiazide (HYDRODIURIL) 12.5 MG tablet Take 1 tablet (12.5 mg total) by mouth daily.   meclizine (ANTIVERT) 25 MG tablet Take 1 tablet (25 mg total) by mouth 2 (two) times daily as needed for dizziness.   sertraline (ZOLOFT) 50 MG tablet TAKE 1/2 TAB AT BEDTIME FOR 2 WEEKS, THEN 1 TAB AT BEDTIME   Bioflavonoid Products (VITAMIN C) CHEW Chew 1 tablet by mouth daily. (Patient not taking: Reported on 01/06/2021)   diltiazem (CARDIZEM CD) 120 MG 24 hr capsule  (Patient not taking: Reported on 01/01/2021)   donepezil (ARICEPT) 5 MG tablet Take 5 mg by mouth daily. (Patient not taking: Reported on 01/06/2021)   No facility-administered encounter medications on file as of 01/06/2021.    Allergies (verified) Zolpidem tartrate   History: Past Medical History:  Diagnosis Date   Breast cancer ([place]) 1980   right breast ca   GERD (gastroesophageal reflux disease)    Hypercholesteremia    Hypertension    Mild cognitive impairment    seen by neurology   Past Surgical History:  Procedure Laterality Date   ABDOMINAL HYSTERECTOMY  1990   Total   APPENDECTOMY     BREAST EXCISIONAL BIOPSY Right 1980's   lumpectomy lobular ca pt stated no chemo no rad   BUNIONECTOMY Right  CATARACT EXTRACTION W/ INTRAOCULAR LENS  IMPLANT, BILATERAL     CESAREAN SECTION     ESOPHAGOGASTRODUODENOSCOPY  2013   Barrett's esophagus   ESOPHAGOGASTRODUODENOSCOPY (EGD) WITH PROPOFOL N/A 12/14/2015   Procedure: ESOPHAGOGASTRODUODENOSCOPY (EGD) WITH PROPOFOL;  Surgeon: Lucilla Lame, MD;  Location: [facility];  Service: Endoscopy;  Laterality: N/A;    TONSILLECTOMY     Family History  Problem Relation Age of Onset   Leukemia Father    Uterine cancer Mother    Breast cancer Neg Hx    Social History   Socioeconomic History   Marital status: Widowed    Spouse name: Not on file   Number of children: 5   Years of education: Not on file   Highest education level: Not on file  Occupational History   Occupation: retired    Comment: realtor  Tobacco Use   Smoking status: Never   Smokeless tobacco: Never  Vaping Use   Vaping Use: Never used  Substance and Sexual Activity   Alcohol use: No    Alcohol/week: 0.0 standard drinks   Drug use: No   Sexual activity: Never  Other Topics Concern   Not on file  Social History Narrative   Pt moved here from [place], daughter lives in [place]. Pt currently lives alone in a retirement community   Social Determinants of Health   Financial Resource Strain: Low Risk    Difficulty of Paying Living Expenses: Not hard at all  Food Insecurity: No Food Insecurity   Worried About Charity fundraiser in the Last Year: Never true   Arboriculturist in the Last Year: Never true  Transportation Needs: No Transportation Needs   Lack of Transportation (Medical): No   Lack of Transportation (Non-Medical): No  Physical Activity: Inactive   Days of Exercise per Week: 0 days   Minutes of Exercise per Session: 0 min  Stress: No Stress Concern Present   Feeling of Stress : Not at all  Social Connections: Moderately Isolated   Frequency of Communication with Friends and Family: More than three times a week   Frequency of Social Gatherings with Friends and Family: Twice a week   Attends Religious Services: More than 4 times per year   Active Member of [employer] or Organizations: No   Attends Archivist Meetings: Never   Marital Status: Widowed    Tobacco Counseling Counseling given: Not Answered   Clinical Intake:  Pre-visit preparation completed: Yes  Pain : No/denies pain      Nutritional Risks: None Diabetes: No  How often do you need to have someone help you when you read instructions, pamphlets, or other written materials from your doctor or pharmacy?: 1 - Never    Interpreter Needed?: No  Information entered by :: [name] LPN   Activities of Daily Living In your present state of health, do you have any difficulty performing the following activities: 01/06/2021 07/17/2020  Hearing? N N  Vision? N N  Difficulty concentrating or making decisions? Tempie Donning  Walking or climbing stairs? N N  Dressing or bathing? N N  Doing errands, shopping? Y N  Preparing Food and eating ? N -  Using the Toilet? N -  In the past six months, have you accidently leaked urine? Y -  Do you have problems with loss of bowel control? N -  Managing your Medications? N -  Managing your Finances? N -  Housekeeping or managing your Housekeeping? N -  Some recent data might be hidden    Patient Care Team: Glean Hess, MD as PCP - General (Internal Medicine) Vladimir Crofts, MD as Consulting Physician (Neurology) Ubaldo Glassing Javier Docker, MD as Consulting Physician (Cardiology)  Indicate any recent Medical Services you may have received from other than [facility] providers in the past year (date may be approximate).     Assessment:   This is a routine wellness examination for [place].  Hearing/Vision screen Hearing Screening - Comments:: Pt denies hearing difficulty Vision Screening - Comments:: Annual vision screening done at [facility]  Dietary issues and exercise activities discussed: Current Exercise Habits: The patient does not participate in regular exercise at present, Exercise limited by: None identified   Goals Addressed   None    Depression Screen PHQ 2/9 Scores 01/06/2021 07/17/2020 06/29/2020 01/20/2020 09/04/2019 05/09/2019 03/18/2019  PHQ - 2 Score 0 3 0 0 2 4 4   PHQ- 9 Score 1 4 0 0 2 4 4     Fall Risk Fall Risk  01/06/2021 07/17/2020 06/29/2020 01/20/2020  11/28/2019  Falls in the past year? 0 1 0 0 0  Number falls in past yr: 0 0 - - -  Injury with Fall? 0 0 - - -  Risk for fall due to : Impaired balance/gait - - - -  Risk for fall due to: Comment - - - - -  Follow up Falls prevention discussed Falls evaluation completed Falls evaluation completed Falls evaluation completed -    FALL RISK PREVENTION PERTAINING TO THE HOME:  Any stairs in or around the home? No  If so, are there any without handrails? No  Home free of loose throw rugs in walkways, pet beds, electrical cords, etc? Yes  Adequate lighting in your home to reduce risk of falls? Yes   ASSISTIVE DEVICES UTILIZED TO PREVENT FALLS:  Life alert? Yes  Use of a cane, walker or w/c? No  Grab bars in the bathroom? Yes  Shower chair or bench in shower? No  Elevated toilet seat or a handicapped toilet? Yes   TIMED UP AND GO:  Was the test performed? No . Telephonic visit.   Cognitive Function: Cognitive status assessed by direct observation. Patient has current diagnosis of cognitive impairment. Patient is followed by neurology for ongoing assessment. Patient is unable to complete screening 6CIT or MMSE.     MMSE - Mini Mental State Exam 06/05/2017  Orientation to time 5  Orientation to Place 5  Registration 3  Attention/ Calculation 5  Recall 2  Language- name 2 objects 2  Language- repeat 1  Language- follow 3 step command 3  Language- read & follow direction 1  Write a sentence 1  Copy design 1  Total score 29     6CIT Screen 09/04/2019 11/17/2016 10/09/2015  What Year? 0 points 0 points 0 points  What month? 0 points 0 points 0 points  What time? 0 points 0 points 0 points  Count back from 20 0 points 0 points 0 points  Months in reverse 0 points 0 points 0 points  Repeat phrase 2 points 0 points 0 points  Total Score 2 0 0    Immunizations Immunization History  Administered Date(s) Administered   Influenza, High Dose Seasonal PF 09/21/2016, 10/24/2017,  11/03/2018   Influenza,inj,Quad PF,6+ Mos 10/20/2014, 10/09/2015   Influenza-Unspecified 10/17/2013, 10/09/2015, 11/03/2018, 10/06/2020   PFIZER(Purple Top)SARS-COV-2 Vaccination 02/18/2019, 03/11/2019   Pneumococcal Conjugate-13 06/06/2014   Pneumococcal Polysaccharide-23 01/19/2012   Tdap 01/18/2010  Zoster Recombinat (Shingrix) 09/05/2019   Zoster, Live 01/19/2012    TDAP status: Due, Education has been provided regarding the importance of this vaccine. Advised may receive this vaccine at local pharmacy or Health Dept. Aware to provide a copy of the vaccination record if obtained from local pharmacy or Health Dept. Verbalized acceptance and understanding.  Flu Vaccine status: Up to date  Pneumococcal vaccine status: Up to date  Covid-19 vaccine status: Completed vaccines  Qualifies for Shingles Vaccine? Yes   Zostavax completed Yes   Shingrix Completed?: Yes - due for second dose  Screening Tests Health Maintenance  Topic Date Due   COVID-19 Vaccine (3 - Pfizer risk series) 04/08/2019   Zoster Vaccines- Shingrix (2 of 2) 10/31/2019   MAMMOGRAM  05/20/2020   TETANUS/TDAP  01/19/2021 (Originally 01/19/2020)   Pneumonia Vaccine 66+ Years old  Completed   INFLUENZA VACCINE  Completed   DEXA SCAN  Completed   HPV VACCINES  Aged Out    Health Maintenance  Health Maintenance Due  Topic Date Due   COVID-19 Vaccine (3 - Pfizer risk series) 04/08/2019   Zoster Vaccines- Shingrix (2 of 2) 10/31/2019   MAMMOGRAM  05/20/2020    Colorectal cancer screening: No longer required.   Mammogram status: Completed 05/21/19. Repeat every year  Bone Density status: Completed 10/23/18. Results reflect: Bone density results: OSTEOPOROSIS. Repeat every 2 years. Pt to discuss with Dr. Army Melia at next appt due to previously on reclast with endocrinology who stated patient needed an appt to discuss in 03/2020 prior to ordering additional treatment.   Lung Cancer Screening: (Low Dose CT Chest  recommended if Age 4-80 years, 30 pack-year currently smoking OR have quit w/in 15years.) does not qualify.   Additional Screening:  Hepatitis C Screening: does not qualify  Vision Screening: Recommended annual ophthalmology exams for early detection of glaucoma and other disorders of the eye. Is the patient up to date with their annual eye exam?  Yes  Who is the provider or what is the name of the office in which the patient attends annual eye exams? [facility].   Dental Screening: Recommended annual dental exams for proper oral hygiene  Community Resource Referral / Chronic Care Management: CRR required this visit?  No   CCM required this visit?  No      Plan:     I have personally reviewed and noted the following in the patients chart:   Medical and social history Use of alcohol, tobacco or illicit drugs  Current medications and supplements including opioid prescriptions.  Functional ability and status Nutritional status Physical activity Advanced directives List of other physicians Hospitalizations, surgeries, and ER visits in previous 12 months Vitals Screenings to include cognitive, depression, and falls Referrals and appointments  In addition, I have reviewed and discussed with patient certain preventive protocols, quality metrics, and best practice recommendations. A written personalized care plan for preventive services as well as general preventive health recommendations were provided to patient.     [name], LPN   [id number]   Nurse Notes: per patient's daughter [name], pt was seen at ER on 01/01/21 due to vertigo and does not remember being in the hospital. Pt to follow up with neurology and possibly ENT. Pt scheduled for CPE 01/21/21. Pt aware to contact office prior to appt if needed

## 2021-01-20 ENCOUNTER — Telehealth: Payer: Self-pay | Admitting: Internal Medicine

## 2021-01-20 NOTE — Telephone Encounter (Signed)
Called Danielle gave her verbal orders. She verbalized understanding.  KP

## 2021-01-20 NOTE — Telephone Encounter (Unsigned)
Home Health Verbal Orders - Caller/Agency: Danielle Laygo/ [facility] Callback Number: [phone]/LT can be left  Requesting OT/PT and speech therapy  Frequency: eval and treat  For med management, safety, balance  They need actual order signed as well

## 2021-01-21 ENCOUNTER — Ambulatory Visit (INDEPENDENT_AMBULATORY_CARE_PROVIDER_SITE_OTHER): Payer: No Typology Code available for payment source | Admitting: Internal Medicine

## 2021-01-21 ENCOUNTER — Encounter: Payer: Self-pay | Admitting: Internal Medicine

## 2021-01-21 ENCOUNTER — Other Ambulatory Visit: Payer: Self-pay

## 2021-01-21 ENCOUNTER — Other Ambulatory Visit: Payer: Self-pay | Admitting: Internal Medicine

## 2021-01-21 VITALS — BP 146/80 | HR 72 | Ht 59.0 in | Wt 165.0 lb

## 2021-01-21 DIAGNOSIS — R8271 Bacteriuria: Secondary | ICD-10-CM | POA: Diagnosis not present

## 2021-01-21 DIAGNOSIS — Z0000 Encounter for general adult medical examination without abnormal findings: Secondary | ICD-10-CM | POA: Diagnosis not present

## 2021-01-21 DIAGNOSIS — Z1231 Encounter for screening mammogram for malignant neoplasm of breast: Secondary | ICD-10-CM | POA: Diagnosis not present

## 2021-01-21 DIAGNOSIS — R739 Hyperglycemia, unspecified: Secondary | ICD-10-CM

## 2021-01-21 DIAGNOSIS — I700 Atherosclerosis of aorta: Secondary | ICD-10-CM

## 2021-01-21 DIAGNOSIS — I10 Essential (primary) hypertension: Secondary | ICD-10-CM | POA: Diagnosis not present

## 2021-01-21 DIAGNOSIS — K2270 Barrett's esophagus without dysplasia: Secondary | ICD-10-CM | POA: Diagnosis not present

## 2021-01-21 LAB — POCT URINALYSIS DIPSTICK
Bilirubin, UA: NEGATIVE
Blood, UA: NEGATIVE
Glucose, UA: NEGATIVE
Ketones, UA: NEGATIVE
Nitrite, UA: POSITIVE
Protein, UA: NEGATIVE
Spec Grav, UA: 1.015 (ref 1.010–1.025)
Urobilinogen, UA: 0.2 E.U./dL
pH, UA: 6 (ref 5.0–8.0)

## 2021-01-21 MED ORDER — HYDROCHLOROTHIAZIDE 12.5 MG PO TABS: 12.5000 mg | ORAL_TABLET | Freq: Every day | ORAL | 1 refills | Status: DC

## 2021-01-21 MED ORDER — DILTIAZEM HCL ER COATED BEADS 120 MG PO CP24: 120.0000 mg | ORAL_CAPSULE | Freq: Every day | ORAL | 1 refills | Status: DC

## 2021-01-21 MED ORDER — MECLIZINE HCL 25 MG PO TABS: 25.0000 mg | ORAL_TABLET | Freq: Two times a day (BID) | ORAL | 1 refills | Status: AC | PRN

## 2021-01-21 MED ORDER — OMEPRAZOLE 40 MG PO CPDR: 40.0000 mg | DELAYED_RELEASE_CAPSULE | Freq: Every day | ORAL | 1 refills | Status: DC
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
## 2021-01-22 LAB — COMPREHENSIVE METABOLIC PANEL
ALT: 17 IU/L (ref 0–32)
AST: 19 IU/L (ref 0–40)
Albumin/Globulin Ratio: 1.8 (ref 1.2–2.2)
Albumin: 4.3 g/dL (ref 3.6–4.6)
Alkaline Phosphatase: 85 IU/L (ref 44–121)
BUN/Creatinine Ratio: 17 (ref 12–28)
BUN: 15 mg/dL (ref 8–27)
Bilirubin Total: 0.4 mg/dL (ref 0.0–1.2)
CO2: 25 mmol/L (ref 20–29)
Calcium: 9.4 mg/dL (ref 8.7–10.3)
Chloride: 105 mmol/L (ref 96–106)
Creatinine, Ser: 0.88 mg/dL (ref 0.57–1.00)
Globulin, Total: 2.4 g/dL (ref 1.5–4.5)
Glucose: 108 mg/dL — ABNORMAL HIGH (ref 70–99)
Potassium: 4.2 mmol/L (ref 3.5–5.2)
Sodium: 142 mmol/L (ref 134–144)
Total Protein: 6.7 g/dL (ref 6.0–8.5)
eGFR: 65 mL/min/{1.73_m2} (ref >59)

## 2021-01-22 LAB — CBC WITH DIFFERENTIAL/PLATELET
Basophils Absolute: 0.1 10*3/uL (ref 0.0–0.2)
Basos: 1 %
EOS (ABSOLUTE): 0.3 10*3/uL (ref 0.0–0.4)
Eos: 4 %
Hematocrit: 43.6 % (ref 34.0–46.6)
Hemoglobin: 14.5 g/dL (ref 11.1–15.9)
Immature Grans (Abs): 0 10*3/uL (ref 0.0–0.1)
Immature Granulocytes: 0 %
Lymphocytes Absolute: 1.7 10*3/uL (ref 0.7–3.1)
Lymphs: 23 %
MCH: 29.5 pg (ref 26.6–33.0)
MCHC: 33.3 g/dL (ref 31.5–35.7)
MCV: 89 fL (ref 79–97)
Monocytes Absolute: 0.6 10*3/uL (ref 0.1–0.9)
Monocytes: 9 %
Neutrophils Absolute: 4.7 10*3/uL (ref 1.4–7.0)
Neutrophils: 63 %
Platelets: 226 10*3/uL (ref 150–450)
RBC: 4.91 x10E6/uL (ref 3.77–5.28)
RDW: 13.1 % (ref 11.7–15.4)
WBC: 7.4 10*3/uL (ref 3.4–10.8)

## 2021-01-22 LAB — LIPID PANEL
Chol/HDL Ratio: 5 ratio — ABNORMAL HIGH (ref 0.0–4.4)
Cholesterol, Total: 312 mg/dL — ABNORMAL HIGH (ref 100–199)
HDL: 63 mg/dL (ref >39)
LDL Chol Calc (NIH): 215 mg/dL — ABNORMAL HIGH (ref 0–99)
Triglycerides: 180 mg/dL — ABNORMAL HIGH (ref 0–149)
VLDL Cholesterol Cal: 34 mg/dL (ref 5–40)

## 2021-01-22 LAB — TSH: TSH: 2.95 u[IU]/mL (ref 0.450–4.500)

## 2021-01-22 LAB — HEMOGLOBIN A1C
Est. average glucose Bld gHb Est-mCnc: 131 mg/dL
Hgb A1c MFr Bld: 6.2 % — ABNORMAL HIGH (ref 4.8–5.6)

## 2021-01-23 LAB — URINE CULTURE

## 2021-01-25 ENCOUNTER — Other Ambulatory Visit: Payer: Self-pay | Admitting: Internal Medicine

## 2021-01-25 DIAGNOSIS — B9620 Unspecified Escherichia coli [E. coli] as the cause of diseases classified elsewhere: Secondary | ICD-10-CM

## 2021-01-25 MED ORDER — SULFAMETHOXAZOLE-TRIMETHOPRIM 800-160 MG PO TABS: 1.0000 | ORAL_TABLET | Freq: Two times a day (BID) | ORAL | 0 refills | Status: AC

## 2021-02-09 ENCOUNTER — Ambulatory Visit (INDEPENDENT_AMBULATORY_CARE_PROVIDER_SITE_OTHER): Payer: No Typology Code available for payment source | Admitting: Internal Medicine

## 2021-02-09 ENCOUNTER — Other Ambulatory Visit: Payer: Self-pay

## 2021-02-09 ENCOUNTER — Encounter: Payer: Self-pay | Admitting: Internal Medicine

## 2021-02-09 VITALS — BP 124/78 | HR 69 | Ht 59.0 in | Wt 166.0 lb

## 2021-02-09 DIAGNOSIS — B9620 Unspecified Escherichia coli [E. coli] as the cause of diseases classified elsewhere: Secondary | ICD-10-CM | POA: Diagnosis not present

## 2021-02-09 DIAGNOSIS — G3184 Mild cognitive impairment, so stated: Secondary | ICD-10-CM | POA: Diagnosis not present

## 2021-02-09 DIAGNOSIS — N390 Urinary tract infection, site not specified: Secondary | ICD-10-CM

## 2021-02-09 LAB — POCT URINALYSIS DIPSTICK
Bilirubin, UA: NEGATIVE
Blood, UA: NEGATIVE
Glucose, UA: NEGATIVE
Ketones, UA: NEGATIVE
Leukocytes, UA: NEGATIVE
Nitrite, UA: NEGATIVE
Protein, UA: NEGATIVE
Spec Grav, UA: 1.015 (ref 1.010–1.025)
Urobilinogen, UA: 0.2 E.U./dL
pH, UA: 6.5 (ref 5.0–8.0)
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
## 2021-02-12 DIAGNOSIS — F02818 Dementia in other diseases classified elsewhere, unspecified severity, with other behavioral disturbance: Secondary | ICD-10-CM | POA: Diagnosis not present

## 2021-02-12 DIAGNOSIS — Z8659 Personal history of other mental and behavioral disorders: Secondary | ICD-10-CM | POA: Diagnosis not present

## 2021-02-12 DIAGNOSIS — G301 Alzheimer's disease with late onset: Secondary | ICD-10-CM | POA: Diagnosis not present

## 2021-03-16 ENCOUNTER — Telehealth: Payer: Self-pay | Admitting: Internal Medicine

## 2021-03-16 NOTE — Telephone Encounter (Signed)
Copied from [place] [phone]. Topic: General - Other >> Mar 16, 2021 11:27 AM Alanda Slim E wrote: Reason for CRM: [name] is calling to request an FL2 for this pt/ Dr. Manuella Ghazi wants pt to get into a memory care facility where the doors will look so pt can not wonder out/ please advise when [name] can pick this up

## 2021-03-24 NOTE — Telephone Encounter (Signed)
Daughter called back to follow up on Fl2.  She was given the info to make appt. ?Appt was made ?

## 2021-03-24 NOTE — Telephone Encounter (Signed)
Noted  KP 

## 2021-04-02 ENCOUNTER — Encounter: Payer: Self-pay | Admitting: Internal Medicine

## 2021-04-02 ENCOUNTER — Other Ambulatory Visit: Payer: Self-pay

## 2021-04-02 ENCOUNTER — Ambulatory Visit (INDEPENDENT_AMBULATORY_CARE_PROVIDER_SITE_OTHER): Payer: No Typology Code available for payment source | Admitting: Internal Medicine

## 2021-04-02 VITALS — BP 136/80 | HR 93 | Ht 59.0 in | Wt 166.4 lb

## 2021-04-02 DIAGNOSIS — F02818 Dementia in other diseases classified elsewhere, unspecified severity, with other behavioral disturbance: Secondary | ICD-10-CM | POA: Diagnosis not present

## 2021-04-02 DIAGNOSIS — G8929 Other chronic pain: Secondary | ICD-10-CM

## 2021-04-02 DIAGNOSIS — M545 Low back pain, unspecified: Secondary | ICD-10-CM

## 2021-04-02 DIAGNOSIS — I10 Essential (primary) hypertension: Secondary | ICD-10-CM | POA: Diagnosis not present

## 2021-04-02 DIAGNOSIS — G301 Alzheimer's disease with late onset: Secondary | ICD-10-CM | POA: Diagnosis not present

## 2021-04-02 DIAGNOSIS — M170 Bilateral primary osteoarthritis of knee: Secondary | ICD-10-CM
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
## 2021-04-30 ENCOUNTER — Ambulatory Visit (INDEPENDENT_AMBULATORY_CARE_PROVIDER_SITE_OTHER): Payer: No Typology Code available for payment source | Admitting: Internal Medicine

## 2021-04-30 ENCOUNTER — Encounter: Payer: Self-pay | Admitting: Internal Medicine

## 2021-04-30 ENCOUNTER — Other Ambulatory Visit: Payer: Self-pay | Admitting: Internal Medicine

## 2021-04-30 VITALS — BP 138/70 | HR 75 | Ht 59.0 in | Wt 167.0 lb

## 2021-04-30 DIAGNOSIS — R8271 Bacteriuria: Secondary | ICD-10-CM | POA: Diagnosis not present

## 2021-04-30 DIAGNOSIS — F02B3 Dementia in other diseases classified elsewhere, moderate, with mood disturbance: Secondary | ICD-10-CM

## 2021-04-30 DIAGNOSIS — G301 Alzheimer's disease with late onset: Secondary | ICD-10-CM | POA: Diagnosis not present

## 2021-04-30 LAB — POC URINALYSIS WITH MICROSCOPIC (NON AUTO)MANUAL RESULT
Bilirubin, UA: NEGATIVE
Crystals: 0
Glucose, UA: NEGATIVE
Ketones, UA: NEGATIVE
Leukocytes, UA: NEGATIVE
Mucus, UA: 0
Nitrite, UA: NEGATIVE
Protein, UA: POSITIVE — AB
RBC: 5 M/uL (ref 4.04–5.48)
Spec Grav, UA: 1.025 (ref 1.010–1.025)
Urobilinogen, UA: 0.2 E.U./dL
WBC Casts, UA: 5
pH, UA: 5 (ref 5.0–8.0)
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
## 2021-05-03 ENCOUNTER — Other Ambulatory Visit: Payer: Self-pay | Admitting: Internal Medicine

## 2021-05-03 DIAGNOSIS — R8271 Bacteriuria: Secondary | ICD-10-CM

## 2021-05-03 LAB — URINE CULTURE

## 2021-05-03 MED ORDER — CEFUROXIME AXETIL 250 MG PO TABS: 250.0000 mg | ORAL_TABLET | Freq: Two times a day (BID) | ORAL | 0 refills | Status: AC

## 2021-05-12 DIAGNOSIS — R2689 Other abnormalities of gait and mobility: Secondary | ICD-10-CM | POA: Diagnosis not present

## 2021-05-12 DIAGNOSIS — Z9181 History of falling: Secondary | ICD-10-CM | POA: Diagnosis not present

## 2021-05-12 DIAGNOSIS — R2681 Unsteadiness on feet: Secondary | ICD-10-CM | POA: Diagnosis not present

## 2021-05-12 DIAGNOSIS — M158 Other polyosteoarthritis: Secondary | ICD-10-CM | POA: Diagnosis not present

## 2021-05-13 DIAGNOSIS — M6281 Muscle weakness (generalized): Secondary | ICD-10-CM | POA: Diagnosis not present

## 2021-05-13 DIAGNOSIS — R41841 Cognitive communication deficit: Secondary | ICD-10-CM | POA: Diagnosis not present

## 2021-05-19 DIAGNOSIS — Z9181 History of falling: Secondary | ICD-10-CM | POA: Diagnosis not present

## 2021-05-19 DIAGNOSIS — R2681 Unsteadiness on feet: Secondary | ICD-10-CM | POA: Diagnosis not present

## 2021-05-19 DIAGNOSIS — R2689 Other abnormalities of gait and mobility: Secondary | ICD-10-CM | POA: Diagnosis not present

## 2021-05-19 DIAGNOSIS — M158 Other polyosteoarthritis: Secondary | ICD-10-CM | POA: Diagnosis not present

## 2021-05-20 DIAGNOSIS — M6281 Muscle weakness (generalized): Secondary | ICD-10-CM | POA: Diagnosis not present

## 2021-05-21 ENCOUNTER — Ambulatory Visit: Payer: Medicare HMO | Admitting: Internal Medicine

## 2021-05-28 DIAGNOSIS — R2689 Other abnormalities of gait and mobility: Secondary | ICD-10-CM | POA: Diagnosis not present

## 2021-05-28 DIAGNOSIS — Z9181 History of falling: Secondary | ICD-10-CM | POA: Diagnosis not present

## 2021-05-28 DIAGNOSIS — M158 Other polyosteoarthritis: Secondary | ICD-10-CM | POA: Diagnosis not present

## 2021-05-28 DIAGNOSIS — R2681 Unsteadiness on feet: Secondary | ICD-10-CM | POA: Diagnosis not present

## 2021-06-01 DIAGNOSIS — R41841 Cognitive communication deficit: Secondary | ICD-10-CM | POA: Diagnosis not present

## 2021-06-18 DIAGNOSIS — F02818 Dementia in other diseases classified elsewhere, unspecified severity, with other behavioral disturbance: Secondary | ICD-10-CM | POA: Diagnosis not present

## 2021-06-18 DIAGNOSIS — G301 Alzheimer's disease with late onset: Secondary | ICD-10-CM | POA: Diagnosis not present

## 2021-06-18 DIAGNOSIS — Z8659 Personal history of other mental and behavioral disorders: Secondary | ICD-10-CM | POA: Diagnosis not present

## 2021-06-22 DIAGNOSIS — R2681 Unsteadiness on feet: Secondary | ICD-10-CM | POA: Diagnosis not present

## 2021-06-22 DIAGNOSIS — M158 Other polyosteoarthritis: Secondary | ICD-10-CM | POA: Diagnosis not present

## 2021-06-22 DIAGNOSIS — R41841 Cognitive communication deficit: Secondary | ICD-10-CM | POA: Diagnosis not present

## 2021-06-22 DIAGNOSIS — M6281 Muscle weakness (generalized): Secondary | ICD-10-CM | POA: Diagnosis not present

## 2021-06-22 DIAGNOSIS — Z9181 History of falling: Secondary | ICD-10-CM | POA: Diagnosis not present

## 2021-06-22 DIAGNOSIS — R2689 Other abnormalities of gait and mobility: Secondary | ICD-10-CM | POA: Diagnosis not present

## 2021-06-28 DIAGNOSIS — R2681 Unsteadiness on feet: Secondary | ICD-10-CM | POA: Diagnosis not present

## 2021-06-28 DIAGNOSIS — M158 Other polyosteoarthritis: Secondary | ICD-10-CM | POA: Diagnosis not present

## 2021-06-28 DIAGNOSIS — R2689 Other abnormalities of gait and mobility: Secondary | ICD-10-CM | POA: Diagnosis not present

## 2021-06-28 DIAGNOSIS — Z9181 History of falling: Secondary | ICD-10-CM | POA: Diagnosis not present

## 2021-07-22 DIAGNOSIS — R2681 Unsteadiness on feet: Secondary | ICD-10-CM | POA: Diagnosis not present

## 2021-07-22 DIAGNOSIS — M158 Other polyosteoarthritis: Secondary | ICD-10-CM | POA: Diagnosis not present

## 2021-07-22 DIAGNOSIS — M6281 Muscle weakness (generalized): Secondary | ICD-10-CM | POA: Diagnosis not present

## 2021-07-22 DIAGNOSIS — Z9181 History of falling: Secondary | ICD-10-CM | POA: Diagnosis not present

## 2021-07-22 DIAGNOSIS — R2689 Other abnormalities of gait and mobility: Secondary | ICD-10-CM | POA: Diagnosis not present

## 2021-07-24 DIAGNOSIS — R41841 Cognitive communication deficit: Secondary | ICD-10-CM | POA: Diagnosis not present

## 2021-08-20 DIAGNOSIS — M6281 Muscle weakness (generalized): Secondary | ICD-10-CM | POA: Diagnosis not present

## 2021-08-26 DIAGNOSIS — R2689 Other abnormalities of gait and mobility: Secondary | ICD-10-CM | POA: Diagnosis not present

## 2021-08-26 DIAGNOSIS — Z9181 History of falling: Secondary | ICD-10-CM | POA: Diagnosis not present

## 2021-08-26 DIAGNOSIS — R2681 Unsteadiness on feet: Secondary | ICD-10-CM | POA: Diagnosis not present

## 2021-08-26 DIAGNOSIS — M158 Other polyosteoarthritis: Secondary | ICD-10-CM | POA: Diagnosis not present

## 2021-09-15 ENCOUNTER — Other Ambulatory Visit: Payer: Self-pay | Admitting: Internal Medicine

## 2021-09-15 DIAGNOSIS — K2270 Barrett's esophagus without dysplasia: Secondary | ICD-10-CM

## 2021-09-15 DIAGNOSIS — I10 Essential (primary) hypertension: Secondary | ICD-10-CM

## 2021-09-16 NOTE — Telephone Encounter (Signed)
Requested Prescriptions  Pending Prescriptions Disp Refills  . hydrochlorothiazide (HYDRODIURIL) 12.5 MG tablet [Pharmacy Med Name: HYDROCHLOROTHIAZIDE 12.'5MG'$  TABLETS] 90 tablet 0    Sig: TAKE 1 TABLET(12.5 MG) BY MOUTH DAILY     Cardiovascular: Diuretics - Thiazide Failed - 09/15/2021  1:36 PM      Failed - Cr in normal range and within 180 days    Creatinine, Ser  Date Value Ref Range Status  01/21/2021 0.88 0.57 - 1.00 mg/dL Final         Failed - K in normal range and within 180 days    Potassium  Date Value Ref Range Status  01/21/2021 4.2 3.5 - 5.2 mmol/L Final         Failed - Na in normal range and within 180 days    Sodium  Date Value Ref Range Status  01/21/2021 142 134 - 144 mmol/L Final         Passed - Last BP in normal range    BP Readings from Last 1 Encounters:  04/30/21 138/70         Passed - Valid encounter within last 6 months    Recent Outpatient Visits          4 months ago Moderate late onset Alzheimer's dementia with mood disturbance ([place])   [place] [facility] at [facility], Jesse Sans, MD   5 months ago Late onset Alzheimer's dementia with behavioral disturbance [facility])   [place] [facility] at [facility], Jesse Sans, MD   7 months ago E. coli UTI (urinary tract infection)   [place] [facility] at [facility], Jesse Sans, MD   7 months ago Annual physical exam   [place] [facility] at [facility], Jesse Sans, MD   1 year ago Inguinal hernia of right side without obstruction or gangrene   [place] [facility] at [facility], Jesse Sans, MD             . diltiazem (CARDIZEM CD) 120 MG 24 hr capsule [Pharmacy Med Name: DILTIAZEM CD '120MG'$  CAPSULES (24 HR)] 90 capsule 1    Sig: TAKE 1 CAPSULE(120 MG) BY MOUTH DAILY     Cardiovascular: Calcium Channel Blockers 3 Passed -  09/15/2021  1:36 PM      Passed - ALT in normal range and within 360 days    ALT  Date Value Ref Range Status  01/21/2021 17 0 - 32 IU/L Final         Passed - AST in normal range and within 360 days    AST  Date Value Ref Range Status  01/21/2021 19 0 - 40 IU/L Final         Passed - Cr in normal range and within 360 days    Creatinine, Ser  Date Value Ref Range Status  01/21/2021 0.88 0.57 - 1.00 mg/dL Final         Passed - Last BP in normal range    BP Readings from Last 1 Encounters:  04/30/21 138/70         Passed - Last Heart Rate in normal range    Pulse Readings from Last 1 Encounters:  04/30/21 75         Passed - Valid encounter within last 6 months    Recent Outpatient Visits  4 months ago Moderate late onset Alzheimer's dementia with mood disturbance ([place])   [place] [facility] at [facility], Jesse Sans, MD   5 months ago Late onset Alzheimer's dementia with behavioral disturbance [facility])   [place] [facility] at [facility], Jesse Sans, MD   7 months ago E. coli UTI (urinary tract infection)   [place] [facility] at [facility], Jesse Sans, MD   7 months ago Annual physical exam   [facility], Jesse Sans, MD   1 year ago Inguinal hernia of right side without obstruction or gangrene   [place] [facility] at [facility], Jesse Sans, MD             . omeprazole (PRILOSEC) 40 MG capsule [Pharmacy Med Name: OMEPRAZOLE '40MG'$  CAPSULES] 90 capsule 1    Sig: TAKE 1 CAPSULE(40 MG) BY MOUTH DAILY     Gastroenterology: Proton Pump Inhibitors Passed - 09/15/2021  1:36 PM      Passed - Valid encounter within last 12 months    Recent Outpatient Visits          4 months ago Moderate late onset Alzheimer's dementia with mood disturbance ([place])   [place]  [facility] at [facility], Jesse Sans, MD   5 months ago Late onset Alzheimer's dementia with behavioral disturbance [facility])   [place] [facility] at [facility], Jesse Sans, MD   7 months ago E. coli UTI (urinary tract infection)   [place] [facility] at [facility], Jesse Sans, MD   7 months ago Annual physical exam    [facility] Medicine at [facility], Jesse Sans, MD   1 year ago Inguinal hernia of right side without obstruction or gangrene   [facility] at [facility], Jesse Sans, MD

## 2021-09-17 DIAGNOSIS — R2689 Other abnormalities of gait and mobility: Secondary | ICD-10-CM | POA: Diagnosis not present

## 2021-09-17 DIAGNOSIS — Z9181 History of falling: Secondary | ICD-10-CM | POA: Diagnosis not present

## 2021-09-17 DIAGNOSIS — R2681 Unsteadiness on feet: Secondary | ICD-10-CM | POA: Diagnosis not present

## 2021-09-17 DIAGNOSIS — M158 Other polyosteoarthritis: Secondary | ICD-10-CM | POA: Diagnosis not present

## 2021-09-23 DIAGNOSIS — M6281 Muscle weakness (generalized): Secondary | ICD-10-CM | POA: Diagnosis not present

## 2021-10-19 DIAGNOSIS — R002 Palpitations: Secondary | ICD-10-CM | POA: Diagnosis not present

## 2021-10-19 DIAGNOSIS — Z8659 Personal history of other mental and behavioral disorders: Secondary | ICD-10-CM | POA: Diagnosis not present

## 2021-10-19 DIAGNOSIS — G301 Alzheimer's disease with late onset: Secondary | ICD-10-CM | POA: Diagnosis not present

## 2021-10-19 DIAGNOSIS — F02818 Dementia in other diseases classified elsewhere, unspecified severity, with other behavioral disturbance: Secondary | ICD-10-CM | POA: Diagnosis not present

## 2021-10-19 DIAGNOSIS — Z79899 Other long term (current) drug therapy: Secondary | ICD-10-CM | POA: Diagnosis not present

## 2021-10-20 DIAGNOSIS — M6281 Muscle weakness (generalized): Secondary | ICD-10-CM | POA: Diagnosis not present

## 2021-10-20 DIAGNOSIS — F0670 Mild neurocognitive disorder due to known physiological condition without behavioral disturbance: Secondary | ICD-10-CM | POA: Diagnosis not present

## 2021-10-20 DIAGNOSIS — F03B0 Unspecified dementia, moderate, without behavioral disturbance, psychotic disturbance, mood disturbance, and anxiety: Secondary | ICD-10-CM | POA: Diagnosis not present

## 2021-11-22 DIAGNOSIS — F03B0 Unspecified dementia, moderate, without behavioral disturbance, psychotic disturbance, mood disturbance, and anxiety: Secondary | ICD-10-CM | POA: Diagnosis not present

## 2021-11-22 DIAGNOSIS — M6281 Muscle weakness (generalized): Secondary | ICD-10-CM | POA: Diagnosis not present

## 2021-11-22 DIAGNOSIS — F0670 Mild neurocognitive disorder due to known physiological condition without behavioral disturbance: Secondary | ICD-10-CM | POA: Diagnosis not present

## 2021-11-26 ENCOUNTER — Ambulatory Visit: Payer: Self-pay

## 2021-11-26 NOTE — Telephone Encounter (Signed)
Pt still needs to be seen. We have to do our own testing in the office before any medication can be sent in.  KP

## 2021-11-26 NOTE — Telephone Encounter (Signed)
Daughter could not bring patient in at this time.

## 2021-11-26 NOTE — Telephone Encounter (Signed)
  Chief Complaint: UTI - Cognitive decline Symptoms: Cognitive decline - positive home UTI test Frequency:  Pertinent Negatives: Patient denies  Disposition: '[]'$ ED /'[]'$ Urgent Care (no appt availability in office) / '[]'$ Appointment(In office/virtual)/ '[]'$  [facility]/ '[]'$ Home Care/ '[x]'$ Refused Recommended Disposition /'[]'$ [place] [facility]/ '[]'$  Follow-up with PCP Additional Notes: Spoke with pt's daughter. Daughter states that pt gets more pronounced cognitive decline when has has UTI. Daughter also performed home UTI test for pt  - positive result.   Daughter is sick and unable to bring pt to the office for testing. Difficult to bring pt in d/t cognitive decline. Daughter would like antibiotics called in for pt.  Please advise.    Summary: Pt daughter requests Rx for antibiotic for uti.   Pt daughter requests Rx for antibiotic for uti. Cb# [phone]        Reason for Disposition  All other urine symptoms  Answer Assessment - Initial Assessment Questions 1. SYMPTOM: "What's the main symptom you're concerned about?" (e.g., frequency, incontinence)     Cognitive decline+ at home UTI test 2. ONSET: "When did the    start?"      3. PAIN: "Is there any pain?" If Yes, ask: "How bad is it?" (Scale: 1-10; mild, moderate, severe)      4. CAUSE: "What do you think is causing the symptoms?"     uti 5. OTHER SYMPTOMS: "Do you have any other symptoms?" (e.g., blood in urine, fever, flank pain, pain with urination)     6. PREGNANCY: "Is there any chance you are pregnant?" "When was your last menstrual period?"  Protocols used: Urinary Symptoms-A-AH

## 2021-12-03 ENCOUNTER — Ambulatory Visit (INDEPENDENT_AMBULATORY_CARE_PROVIDER_SITE_OTHER): Payer: No Typology Code available for payment source | Admitting: Internal Medicine

## 2021-12-03 ENCOUNTER — Encounter: Payer: Self-pay | Admitting: Internal Medicine

## 2021-12-03 VITALS — BP 134/82 | HR 73 | Ht 59.0 in | Wt 168.0 lb

## 2021-12-03 DIAGNOSIS — I10 Essential (primary) hypertension: Secondary | ICD-10-CM

## 2021-12-03 DIAGNOSIS — F02818 Dementia in other diseases classified elsewhere, unspecified severity, with other behavioral disturbance: Secondary | ICD-10-CM | POA: Diagnosis not present

## 2021-12-03 DIAGNOSIS — R7303 Prediabetes: Secondary | ICD-10-CM | POA: Diagnosis not present

## 2021-12-03 DIAGNOSIS — R399 Unspecified symptoms and signs involving the genitourinary system: Secondary | ICD-10-CM | POA: Diagnosis not present

## 2021-12-03 DIAGNOSIS — G301 Alzheimer's disease with late onset: Secondary | ICD-10-CM

## 2021-12-03 DIAGNOSIS — K2270 Barrett's esophagus without dysplasia: Secondary | ICD-10-CM | POA: Diagnosis not present

## 2021-12-03 DIAGNOSIS — E782 Mixed hyperlipidemia: Secondary | ICD-10-CM

## 2021-12-03 LAB — POCT URINALYSIS DIPSTICK
Bilirubin, UA: NEGATIVE
Blood, UA: NEGATIVE
Glucose, UA: NEGATIVE
Ketones, UA: NEGATIVE
Leukocytes, UA: NEGATIVE
Nitrite, UA: NEGATIVE
Protein, UA: NEGATIVE
Spec Grav, UA: <=1.005 — AB (ref 1.010–1.025)
Urobilinogen, UA: 0.2 E.U./dL
pH, UA: 6.5 (ref 5.0–8.0)
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
## 2021-12-04 LAB — LIPID PANEL
Chol/HDL Ratio: 4.7 ratio — ABNORMAL HIGH (ref 0.0–4.4)
Cholesterol, Total: 303 mg/dL — ABNORMAL HIGH (ref 100–199)
HDL: 64 mg/dL (ref >39)
LDL Chol Calc (NIH): 184 mg/dL — ABNORMAL HIGH (ref 0–99)
Triglycerides: 284 mg/dL — ABNORMAL HIGH (ref 0–149)
VLDL Cholesterol Cal: 55 mg/dL — ABNORMAL HIGH (ref 5–40)

## 2021-12-04 LAB — HEMOGLOBIN A1C
Est. average glucose Bld gHb Est-mCnc: 137 mg/dL
Hgb A1c MFr Bld: 6.4 % — ABNORMAL HIGH (ref 4.8–5.6)

## 2021-12-04 LAB — COMPREHENSIVE METABOLIC PANEL
ALT: 20 IU/L (ref 0–32)
AST: 23 IU/L (ref 0–40)
Albumin/Globulin Ratio: 2 (ref 1.2–2.2)
Albumin: 4.3 g/dL (ref 3.7–4.7)
Alkaline Phosphatase: 109 IU/L (ref 44–121)
BUN/Creatinine Ratio: 15 (ref 12–28)
BUN: 15 mg/dL (ref 8–27)
Bilirubin Total: 0.2 mg/dL (ref 0.0–1.2)
CO2: 25 mmol/L (ref 20–29)
Calcium: 9.6 mg/dL (ref 8.7–10.3)
Chloride: 101 mmol/L (ref 96–106)
Creatinine, Ser: 0.98 mg/dL (ref 0.57–1.00)
Globulin, Total: 2.2 g/dL (ref 1.5–4.5)
Glucose: 86 mg/dL (ref 70–99)
Potassium: 3.9 mmol/L (ref 3.5–5.2)
Sodium: 142 mmol/L (ref 134–144)
Total Protein: 6.5 g/dL (ref 6.0–8.5)
eGFR: 57 mL/min/{1.73_m2} — ABNORMAL LOW (ref >59)

## 2021-12-04 LAB — CBC WITH DIFFERENTIAL/PLATELET
Basophils Absolute: 0.1 10*3/uL (ref 0.0–0.2)
Basos: 1 %
EOS (ABSOLUTE): 0.2 10*3/uL (ref 0.0–0.4)
Eos: 2 %
Hematocrit: 42.8 % (ref 34.0–46.6)
Hemoglobin: 14 g/dL (ref 11.1–15.9)
Immature Grans (Abs): 0 10*3/uL (ref 0.0–0.1)
Immature Granulocytes: 0 %
Lymphocytes Absolute: 1.8 10*3/uL (ref 0.7–3.1)
Lymphs: 18 %
MCH: 28.8 pg (ref 26.6–33.0)
MCHC: 32.7 g/dL (ref 31.5–35.7)
MCV: 88 fL (ref 79–97)
Monocytes Absolute: 0.9 10*3/uL (ref 0.1–0.9)
Monocytes: 9 %
Neutrophils Absolute: 7.1 10*3/uL — ABNORMAL HIGH (ref 1.4–7.0)
Neutrophils: 70 %
Platelets: 220 10*3/uL (ref 150–450)
RBC: 4.86 x10E6/uL (ref 3.77–5.28)
RDW: 13 % (ref 11.7–15.4)
WBC: 10 10*3/uL (ref 3.4–10.8)

## 2021-12-04 LAB — TSH: TSH: 3.5 u[IU]/mL (ref 0.450–4.500)

## 2021-12-17 ENCOUNTER — Other Ambulatory Visit: Payer: Self-pay | Admitting: Internal Medicine

## 2021-12-17 DIAGNOSIS — I10 Essential (primary) hypertension: Secondary | ICD-10-CM

## 2021-12-17 DIAGNOSIS — K2270 Barrett's esophagus without dysplasia: Secondary | ICD-10-CM

## 2021-12-17 NOTE — Telephone Encounter (Signed)
Requested Prescriptions  Pending Prescriptions Disp Refills   hydrochlorothiazide (HYDRODIURIL) 12.5 MG tablet [Pharmacy Med Name: HYDROCHLOROTHIAZIDE 12.'5MG'$  TABLETS] 90 tablet 1    Sig: TAKE 1 TABLET(12.5 MG) BY MOUTH DAILY     Cardiovascular: Diuretics - Thiazide Failed - 12/17/2021  3:48 AM      Failed - Valid encounter within last 6 months    Recent Outpatient Visits           2 weeks ago Late onset Alzheimer's dementia with behavioral disturbance ([place])   [facility] at [facility], Evelyn Sans, MD   7 months ago Moderate late onset Alzheimer's dementia with mood disturbance ([place])   [place] [facility] at [facility], Evelyn Sans, MD   8 months ago Late onset Alzheimer's dementia with behavioral disturbance [facility])   [place] [facility] at [facility], Evelyn Sans, MD   10 months ago E. coli UTI (urinary tract infection)   [place] [facility] at [facility], Evelyn Sans, MD   11 months ago Annual physical exam   [place] [facility] at [facility], Evelyn Sans, MD              Passed - Cr in normal range and within 180 days    Creatinine, Ser  Date Value Ref Range Status  12/03/2021 0.98 0.57 - 1.00 mg/dL Final         Passed - K in normal range and within 180 days    Potassium  Date Value Ref Range Status  12/03/2021 3.9 3.5 - 5.2 mmol/L Final         Passed - Na in normal range and within 180 days    Sodium  Date Value Ref Range Status  12/03/2021 142 134 - 144 mmol/L Final         Passed - Last BP in normal range    BP Readings from Last 1 Encounters:  12/03/21 134/82          omeprazole (PRILOSEC) 40 MG capsule [Pharmacy Med Name: OMEPRAZOLE '40MG'$  CAPSULES] 90 capsule 1    Sig: TAKE 1 CAPSULE(40 MG) BY MOUTH DAILY     Gastroenterology: Proton Pump Inhibitors Passed - 12/17/2021   3:48 AM      Passed - Valid encounter within last 12 months    Recent Outpatient Visits           2 weeks ago Late onset Alzheimer's dementia with behavioral disturbance ([place])   [place] [facility] at [facility], Evelyn Sans, MD   7 months ago Moderate late onset Alzheimer's dementia with mood disturbance ([place])   [place] [facility] at [facility], Evelyn Sans, MD   8 months ago Late onset Alzheimer's dementia with behavioral disturbance [facility])   [place] [facility] at [facility], Evelyn Sans, MD   10 months ago E. coli UTI (urinary tract infection)    [facility] at [facility], Evelyn Sans, MD   11 months ago Annual physical exam   [facility] at [facility], Evelyn Sans, MD               diltiazem (CARDIZEM CD) 120 MG 24 hr capsule [Pharmacy Med Name: DILTIAZEM CD '120MG'$  CAPSULES (24  HR)] 90 capsule 1    Sig: TAKE 1 CAPSULE(120 MG) BY MOUTH DAILY     Cardiovascular: Calcium Channel Blockers 3 Failed - 12/17/2021  3:48 AM      Failed - Valid encounter within last 6 months    Recent Outpatient Visits           2 weeks ago Late onset Alzheimer's dementia with behavioral disturbance [facility])   [place] [facility] at [facility], Evelyn Sans, MD   7 months ago Moderate late onset Alzheimer's dementia with mood disturbance ([place])   [place] [facility] at [facility], Evelyn Sans, MD   8 months ago Late onset Alzheimer's dementia with behavioral disturbance [facility])   [place] [facility] at [facility], Evelyn Sans, MD   10 months ago E. coli UTI (urinary tract infection)   [place] [facility] at [facility], Evelyn Sans, MD   11 months ago Annual physical exam    [place] [facility] at [facility], Evelyn Sans, MD              Passed - ALT in normal range and within 360 days    ALT  Date Value Ref Range Status  12/03/2021 20 0 - 32 IU/L Final         Passed - AST in normal range and within 360 days    AST  Date Value Ref Range Status  12/03/2021 23 0 - 40 IU/L Final         Passed - Cr in normal range and within 360 days    Creatinine, Ser  Date Value Ref Range Status  12/03/2021 0.98 0.57 - 1.00 mg/dL Final         Passed - Last BP in normal range    BP Readings from Last 1 Encounters:  12/03/21 134/82         Passed - Last Heart Rate in normal range    Pulse Readings from Last 1 Encounters:  12/03/21 73

## 2021-12-21 DIAGNOSIS — F0670 Mild neurocognitive disorder due to known physiological condition without behavioral disturbance: Secondary | ICD-10-CM | POA: Diagnosis not present

## 2021-12-21 DIAGNOSIS — M6281 Muscle weakness (generalized): Secondary | ICD-10-CM | POA: Diagnosis not present

## 2021-12-21 DIAGNOSIS — F03B0 Unspecified dementia, moderate, without behavioral disturbance, psychotic disturbance, mood disturbance, and anxiety: Secondary | ICD-10-CM | POA: Diagnosis not present

## 2021-12-28 ENCOUNTER — Telehealth: Payer: Self-pay | Admitting: Internal Medicine

## 2021-12-28 NOTE — Telephone Encounter (Signed)
Copied from [place] [id number]. Topic: Medicare AWV >> Dec 28, 2021 11:43 AM Devoria Glassing wrote: Reason for CRM: Attempted to schedule AWV. Unable to LVM.  Will try at later time.

## 2022-01-18 DIAGNOSIS — F0670 Mild neurocognitive disorder due to known physiological condition without behavioral disturbance: Secondary | ICD-10-CM | POA: Diagnosis not present

## 2022-01-18 DIAGNOSIS — F03B0 Unspecified dementia, moderate, without behavioral disturbance, psychotic disturbance, mood disturbance, and anxiety: Secondary | ICD-10-CM | POA: Diagnosis not present

## 2022-01-18 DIAGNOSIS — M6281 Muscle weakness (generalized): Secondary | ICD-10-CM | POA: Diagnosis not present

## 2022-02-10 ENCOUNTER — Ambulatory Visit
Admission: EM | Admit: 2022-02-10 | Discharge: 2022-02-10 | Disposition: A | Discharge status: Home / Self Care | Payer: No Typology Code available for payment source | Attending: Physician Assistant | Admitting: Physician Assistant

## 2022-02-10 DIAGNOSIS — G301 Alzheimer's disease with late onset: Secondary | ICD-10-CM | POA: Diagnosis not present

## 2022-02-10 DIAGNOSIS — N3000 Acute cystitis without hematuria: Secondary | ICD-10-CM

## 2022-02-10 DIAGNOSIS — F0280 Dementia in other diseases classified elsewhere without behavioral disturbance: Secondary | ICD-10-CM

## 2022-02-10 DIAGNOSIS — F05 Delirium due to known physiological condition: Secondary | ICD-10-CM | POA: Diagnosis not present

## 2022-02-10 LAB — URINALYSIS, ROUTINE W REFLEX MICROSCOPIC
Glucose, UA: NEGATIVE mg/dL
Nitrite: NEGATIVE
Protein, ur: NEGATIVE mg/dL
Specific Gravity, Urine: 1.025 (ref 1.005–1.030)
pH: 5.5 (ref 5.0–8.0)

## 2022-02-10 LAB — URINALYSIS, MICROSCOPIC (REFLEX)

## 2022-02-10 MED ORDER — CEPHALEXIN 500 MG PO CAPS: 500.0000 mg | ORAL_CAPSULE | Freq: Two times a day (BID) | ORAL | 0 refills | Status: AC

## 2022-02-10 NOTE — ED Triage Notes (Signed)
Per daughter, the patient is having confusion and she thinks its due to a UTI.    Pt cannot communicate her symptoms because she has dementia.

## 2022-02-10 NOTE — Discharge Instructions (Signed)
-  Possible UTI based on the urinalysis.  We will send for culture and call in a couple days if you need to change antibiotics. - If her confusion worsening is due to UTI there should be a big reversal in the next 2 days and even the next 24 hours there should be signs of improvement. - If the confusion worsens or does not improve in 2 days she should be seen in the emergency department to have further workup to know why she is having acute worsening of her confusion. - If she has any breathing difficulty or wheezing, fevers, URI symptoms, chest pain, shortness of breath, abdominal pain, vomiting, diarrhea, weakness, she should go to the emergency department.

## 2022-02-10 NOTE — ED Provider Notes (Signed)
[facility]    CSN: [id number] Arrival date & time: 02/10/22  1756      History   Chief Complaint Chief Complaint  Patient presents with   Urinary Tract Infection    HPI Evelyn Clements is a 87 y.o. female with history of Alzheimer's.  She presents with her daughter today.  They do provide some history.  Daughter states that over the past few days she has been more confused than normal.  She does live at an assisted living facility and is able to mostly care for herself without much difficulty.  However, over the past few days she has been increasingly confused and packed her suitcase and tried to leave the home.  Mother is afraid to have a UTI.  She says that she has had UTIs in the past she has become confused like this.  Daughter does not go with her so she is unsure if she has had any symptoms at all.  She has not noticed any obvious painful urination or frequency.  There is no reported fever, cough, shortness of breath, vomiting or diarrhea.  She has not recently been ill.  The patient has no complaints at this time but she does appear little confused.  She realizes that she is at a clinic and says that she is ready for dinner.  No other concerns.  HPI  Past Medical History:  Diagnosis Date   Breast cancer ([place]) 1980   right breast ca   GERD (gastroesophageal reflux disease)    Hypercholesteremia    Hypertension    Mild cognitive impairment    seen by neurology    Patient Active Problem List   Diagnosis Date Noted   Aortic atherosclerosis ([place]) 01/20/2020   Non-recurrent bilateral inguinal hernia without obstruction or gangrene 10/22/2019   Late onset Alzheimer's dementia with behavioral disturbance ([place]) 09/21/2018   RBC microcytosis 09/21/2018   DDD (degenerative disc disease), lumbar 05/07/2018   Spondylolisthesis of lumbar region 05/07/2018   Dermoid cyst of scalp 03/16/2018   Eczema 12/18/2017   Chronic bilateral low back pain without sciatica 06/22/2016    Hiatal hernia    Sebaceous cyst of labia 06/01/2015   Epidermal inclusion cyst 06/01/2015   Barrett esophagus 06/21/2014   Essential (primary) hypertension 06/21/2014   Fibrocystic breast 06/21/2014   Insomnia, persistent 06/21/2014   Hyperlipidemia, mixed 06/21/2014   OP (osteoporosis) 06/21/2014   Arthritis of knee, degenerative 06/21/2014    Past Surgical History:  Procedure Laterality Date   ABDOMINAL HYSTERECTOMY  1990   Total   APPENDECTOMY     BREAST EXCISIONAL BIOPSY Right 1980's   lumpectomy lobular ca pt stated no chemo no rad   BUNIONECTOMY Right    CATARACT EXTRACTION W/ INTRAOCULAR LENS  IMPLANT, BILATERAL     CESAREAN SECTION     ESOPHAGOGASTRODUODENOSCOPY  2013   Barrett's esophagus   ESOPHAGOGASTRODUODENOSCOPY (EGD) WITH PROPOFOL N/A 12/14/2015   Procedure: ESOPHAGOGASTRODUODENOSCOPY (EGD) WITH PROPOFOL;  Surgeon: Lucilla Lame, MD;  Location: [place];  Service: Endoscopy;  Laterality: N/A;   TONSILLECTOMY      OB History   No obstetric history on file.      Home Medications    Prior to Admission medications   Medication Sig Start Date End Date Taking? Authorizing Provider  cephALEXin (KEFLEX) 500 MG capsule Take 1 capsule (500 mg total) by mouth 2 (two) times daily for 7 days. 02/10/22 02/17/22 Yes Danton Clap, PA-C  Calcium Carb-Cholecalciferol (CALCIUM + D3) 600-200 MG-UNIT  TABS Take 1 tablet by mouth daily. Patient not taking: Reported on 12/03/2021    [provider]  diltiazem (CARDIZEM CD) 120 MG 24 hr capsule TAKE 1 CAPSULE(120 MG) BY MOUTH DAILY 12/17/21   Glean Hess, MD  hydrochlorothiazide (HYDRODIURIL) 12.5 MG tablet TAKE 1 TABLET(12.5 MG) BY MOUTH DAILY 12/17/21   Glean Hess, MD  LUTEIN PO Take by mouth.    [provider]  meclizine (ANTIVERT) 25 MG tablet Take 1 tablet (25 mg total) by mouth 2 (two) times daily as needed for dizziness. 01/21/21   Glean Hess, MD  memantine (NAMENDA) 5 MG tablet  Take by mouth.    [provider]  omeprazole (PRILOSEC) 40 MG capsule TAKE 1 CAPSULE(40 MG) BY MOUTH DAILY 12/17/21   Glean Hess, MD  risperiDONE (RISPERDAL) 0.25 MG tablet Take 0.25 mg by mouth daily. 03/24/21   [provider]  sertraline (ZOLOFT) 50 MG tablet Take 50 mg by mouth daily. 11/18/18   [provider]    Family History Family History  Problem Relation Age of Onset   Leukemia Father    Uterine cancer Mother    Breast cancer Neg Hx     Social History Social History   Tobacco Use   Smoking status: Never   Smokeless tobacco: Never  Vaping Use   Vaping Use: Never used  Substance Use Topics   Alcohol use: No    Alcohol/week: 0.0 standard drinks of alcohol   Drug use: No     Allergies   Zolpidem tartrate   Review of Systems Review of Systems  Constitutional:  Negative for chills, fatigue and fever.  HENT:  Negative for congestion.   Respiratory:  Negative for cough and shortness of breath.   Cardiovascular:  Negative for chest pain.  Gastrointestinal:  Negative for abdominal pain, diarrhea and vomiting.  Genitourinary:  Negative for dysuria, flank pain, frequency and hematuria.  Skin:  Negative for rash.  Neurological:  Negative for dizziness, syncope and weakness.  Psychiatric/Behavioral:  Positive for confusion. Negative for agitation.      Physical Exam Triage Vital Signs ED Triage Vitals  Enc Vitals Group     BP      Pulse      Resp      Temp      Temp src      SpO2      Weight      Height      Head Circumference      Peak Flow      Pain Score      Pain Loc      Pain Edu?      Excl. in [place]?    No data found.  Updated Vital Signs BP (!) 167/83 (BP Location: Right Arm)   Pulse 80   Temp 98.7 F (37.1 C) (Oral)   Resp 20   SpO2 90%      Physical Exam Vitals and nursing note reviewed.  Constitutional:      General: She is not in acute distress.    Appearance: Normal appearance. She is not  ill-appearing or toxic-appearing.  HENT:     Head: Normocephalic and atraumatic.     Nose: Nose normal.     Mouth/Throat:     Mouth: Mucous membranes are moist.     Pharynx: Oropharynx is clear.  Eyes:     General: No scleral icterus.       Right eye: No discharge.  Left eye: No discharge.     Conjunctiva/sclera: Conjunctivae normal.  Cardiovascular:     Rate and Rhythm: Normal rate and regular rhythm.     Heart sounds: Normal heart sounds.  Pulmonary:     Effort: Pulmonary effort is normal. No respiratory distress.     Breath sounds: Normal breath sounds.  Abdominal:     Palpations: Abdomen is soft.     Tenderness: There is no abdominal tenderness. There is no right CVA tenderness or left CVA tenderness.  Musculoskeletal:     Cervical back: Neck supple.  Skin:    General: Skin is dry.  Neurological:     General: No focal deficit present.     Mental Status: She is alert. Mental status is at baseline.     Cranial Nerves: No cranial nerve deficit.     Motor: No weakness.     Gait: Gait normal.     Comments: Oriented to person and place  Psychiatric:        Mood and Affect: Mood normal.        Behavior: Behavior normal.        Thought Content: Thought content normal.      UC Treatments / Results  Labs (all labs ordered are listed, but only abnormal results are displayed) Labs Reviewed  URINALYSIS, ROUTINE W REFLEX MICROSCOPIC - Abnormal; Notable for the following components:      Result Value   APPearance HAZY (*)    Hgb urine dipstick TRACE (*)    Bilirubin Urine SMALL (*)    Ketones, ur TRACE (*)    Leukocytes,Ua TRACE (*)    All other components within normal limits  URINALYSIS, MICROSCOPIC (REFLEX) - Abnormal; Notable for the following components:   Bacteria, UA FEW (*)    All other components within normal limits  URINE CULTURE    EKG   Radiology No results found.  Procedures Procedures (including critical care time)  Medications Ordered in  UC Medications - No data to display  Initial Impression / Assessment and Plan / UC Course  I have reviewed the triage vital signs and the nursing notes.  Pertinent labs & imaging results that were available during my care of the patient were reviewed by me and considered in my medical decision making (see chart for details).    87 year old female with history of late onset Alzheimer's presents with her daughter for increased confusion over the past few days.  Daughter believes she may have a UTI.  She has not noticed any other symptoms but does report that she does not live with her mother.  BP is 167/83.  Other vitals are normal and stable.  Recheck of oxygen is 94%.  She is not in any acute distress and cooperative with exam.  Normal HEENT exam.  Oriented to person and place.  Chest clear to auscultation heart regular rate and rhythm.  No abdominal tenderness.  Urinalysis obtained today shows hazy urine with hemoglobin, bili, ketones, leukocytes and bacteria.  Will send urine for culture but I do have some suspicion for acute UTI.  Covering with Keflex.  Encouraged rest and fluids.  Advised very close monitoring.  Explained to patient's daughter that she should be improving significantly in the next 2 days and if she is not or if her symptoms worsen she is to be taken immediately to the emergency department.  Final Clinical Impressions(s) / UC Diagnoses   Final diagnoses:  Acute confusional state  Late onset Alzheimer's dementia without  behavioral disturbance, psychotic disturbance, mood disturbance, or anxiety, unspecified dementia severity ([place])  Acute cystitis without hematuria     Discharge Instructions      -Possible UTI based on the urinalysis.  We will send for culture and call in a couple days if you need to change antibiotics. - If her confusion worsening is due to UTI there should be a big reversal in the next 2 days and even the next 24 hours there should be signs of  improvement. - If the confusion worsens or does not improve in 2 days she should be seen in the emergency department to have further workup to know why she is having acute worsening of her confusion. - If she has any breathing difficulty or wheezing, fevers, URI symptoms, chest pain, shortness of breath, abdominal pain, vomiting, diarrhea, weakness, she should go to the emergency department.     ED Prescriptions     Medication Sig Dispense Auth. Provider   cephALEXin (KEFLEX) 500 MG capsule Take 1 capsule (500 mg total) by mouth 2 (two) times daily for 7 days. 14 capsule Danton Clap, PA-C      PDMP not reviewed this encounter.   Danton Clap, PA-C 02/10/22 [phone]

## 2022-02-12 LAB — URINE CULTURE

## 2022-02-21 DIAGNOSIS — M6281 Muscle weakness (generalized): Secondary | ICD-10-CM | POA: Diagnosis not present

## 2022-02-21 DIAGNOSIS — F03B0 Unspecified dementia, moderate, without behavioral disturbance, psychotic disturbance, mood disturbance, and anxiety: Secondary | ICD-10-CM | POA: Diagnosis not present

## 2022-02-21 DIAGNOSIS — F0670 Mild neurocognitive disorder due to known physiological condition without behavioral disturbance: Secondary | ICD-10-CM | POA: Diagnosis not present

## 2022-03-17 ENCOUNTER — Ambulatory Visit: Payer: No Typology Code available for payment source

## 2022-03-17 VITALS — Ht 59.0 in | Wt 168.0 lb

## 2022-03-17 DIAGNOSIS — Z0000 Encounter for general adult medical examination without abnormal findings: Secondary | ICD-10-CM | POA: Diagnosis not present

## 2022-03-17 NOTE — Progress Notes (Signed)
I connected with  Evelyn Clements on 03/17/22 by a audio enabled telemedicine application and verified that I am speaking with the correct person using two identifiers.Spoke w/ daughter Evelyn Clements.  Patient Location: Home  Provider Location: Office/Clinic  I discussed the limitations of evaluation and management by telemedicine. The patient expressed understanding and agreed to proceed.  Subjective:   Evelyn Clements is a 87 y.o. female who presents for Medicare Annual (Subsequent) preventive examination.  Review of Systems     Cardiac Risk Factors include: advanced age (>52mn, >>51women);hypertension     Objective:    There were no vitals filed for this visit. There is no height or weight on file to calculate BMI.     03/17/2022    2:37 PM 01/06/2021   10:05 AM 01/01/2021    9:14 AM 09/04/2019    2:45 PM 08/29/2018    2:25 PM 11/17/2016   10:04 AM 12/14/2015    9:10 AM  Advanced Directives  Does Patient Have a Medical Advance Directive? No Yes Unable to assess, patient is non-responsive or altered mental status Yes Yes Yes No  Type of Advance Directive  [place] will  [place] will [place] will [place] will   Copy of [place] Chart?  Yes - validated most recent copy scanned in chart (See row information)  Yes - validated most recent copy scanned in chart (See row information) Yes - validated most recent copy scanned in chart (See row information) No - copy requested   Would patient like information on creating a medical advance directive? No - Patient declined      No - Patient declined    Current Medications (verified) Outpatient Encounter Medications as of 03/17/2022  Medication Sig   diltiazem (CARDIZEM CD) 120 MG 24 hr capsule TAKE 1 CAPSULE(120 MG) BY MOUTH DAILY   hydrochlorothiazide (HYDRODIURIL) 12.5 MG tablet TAKE 1 TABLET(12.5 MG) BY MOUTH DAILY    meclizine (ANTIVERT) 25 MG tablet Take 1 tablet (25 mg total) by mouth 2 (two) times daily as needed for dizziness.   memantine (NAMENDA) 5 MG tablet Take by mouth.   omeprazole (PRILOSEC) 40 MG capsule TAKE 1 CAPSULE(40 MG) BY MOUTH DAILY   risperiDONE (RISPERDAL) 0.25 MG tablet Take 0.25 mg by mouth daily.   sertraline (ZOLOFT) 50 MG tablet Take 50 mg by mouth daily.   Calcium Carb-Cholecalciferol (CALCIUM + D3) 600-200 MG-UNIT TABS Take 1 tablet by mouth daily. (Patient not taking: Reported on 12/03/2021)   LUTEIN PO Take by mouth. (Patient not taking: Reported on 03/17/2022)   No facility-administered encounter medications on file as of 03/17/2022.    Allergies (verified) Zolpidem tartrate   History: Past Medical History:  Diagnosis Date   [place]ast cancer [place]   right breast ca   GERD (gastroesophageal reflux disease)    Hypercholesteremia    Hypertension    Mild cognitive impairment    seen by neurology   Past Surgical History:  Procedure Laterality Date   ABDOMINAL HYSTERECTOMY  1990   Total   APPENDECTOMY     BREAST EXCISIONAL BIOPSY Right 1980's   lumpectomy lobular ca pt stated no chemo no rad   BUNIONECTOMY Right    CATARACT EXTRACTION W/ INTRAOCULAR LENS  IMPLANT, BILATERAL     CESAREAN SECTION     ESOPHAGOGASTRODUODENOSCOPY  2013   Barrett's esophagus   ESOPHAGOGASTRODUODENOSCOPY (EGD) WITH PROPOFOL N/A 12/14/2015   Procedure: ESOPHAGOGASTRODUODENOSCOPY (EGD)  WITH PROPOFOL;  Surgeon: Lucilla Lame, MD;  Location: [place];  Service: Endoscopy;  Laterality: N/A;   TONSILLECTOMY     Family History  Problem Relation Age of Onset   Leukemia Father    Uterine cancer Mother    Breast cancer Neg Hx    Social History   Socioeconomic History   Marital status: Widowed    Spouse name: Not on file   Number of children: 5   Years of education: Not on file   Highest education level: Not on file  Occupational History   Occupation: retired    Comment:  realtor  Tobacco Use   Smoking status: Never   Smokeless tobacco: Never  Vaping Use   Vaping Use: Never used  Substance and Sexual Activity   Alcohol use: No    Alcohol/week: 0.0 standard drinks of alcohol   Drug use: No   Sexual activity: Never  Other Topics Concern   Not on file  Social History Narrative   Pt moved here from [place], daughter lives in [place]. Pt currently lives alone in a retirement community   Social Determinants of Health   Financial Resource Strain: Medium Risk (03/17/2022)   Overall Financial Resource Strain (CARDIA)    Difficulty of Paying Living Expenses: Somewhat hard  Food Insecurity: No Food Insecurity (03/17/2022)   Hunger Vital Sign    Worried About Running Out of Food in the Last Year: Never true    Ran Out of Food in the Last Year: Never true  Transportation Needs: No Transportation Needs (03/17/2022)   PRAPARE - Hydrologist (Medical): No    Lack of Transportation (Non-Medical): No  Physical Activity: Insufficiently Active (03/17/2022)   Exercise Vital Sign    Days of Exercise per Week: 2 days    Minutes of Exercise per Session: 20 min  Stress: No Stress Concern Present (03/17/2022)   [place]    Feeling of Stress : Only a little  Social Connections: Moderately Isolated (03/17/2022)   Social Connection and Isolation Panel [NHANES]    Frequency of Communication with Friends and Family: More than three times a week    Frequency of Social Gatherings with Friends and Family: More than three times a week    Attends Religious Services: 1 to 4 times per year    Active Member of [employer] or Organizations: No    Attends Archivist Meetings: Never    Marital Status: Widowed    Tobacco Counseling Counseling given: Not Answered   Clinical Intake:  Pre-visit preparation completed: Yes  Pain : No/denies pain     Nutritional Risks:  None Diabetes: No  How often do you need to have someone help you when you read instructions, pamphlets, or other written materials from your doctor or pharmacy?: 1 - Never  Diabetic?no  Interpreter Needed?: No  Information entered by :: Kirke Shaggy, LPN   Activities of Daily Living    03/17/2022    2:38 PM 04/30/2021    2:16 PM  In your present state of health, do you have any difficulty performing the following activities:  Hearing? 0 0  Vision? 0 0  Difficulty concentrating or making decisions? 1 1  Walking or climbing stairs? 0 1  Dressing or bathing? 1 0  Doing errands, shopping? 1 1  Preparing Food and eating ? Y   Using the Toilet? N   In the past six months,  have you accidently leaked urine? N   Do you have problems with loss of bowel control? N   Managing your Medications? Y   Managing your Finances? Y   Housekeeping or managing your Housekeeping? Y     Patient Care Team: Glean Hess, MD as PCP - General (Internal Medicine) Vladimir Crofts, MD as Consulting Physician (Neurology) Ubaldo Glassing Javier Docker, MD as Consulting Physician (Cardiology)  Indicate any recent Medical Services you may have received from other than [facility] providers in the past year (date may be approximate).     Assessment:   This is a routine wellness examination for [place].  Hearing/Vision screen Hearing Screening - Comments:: No aids Vision Screening - Comments:: No glasses   Dietary issues and exercise activities discussed: Current Exercise Habits: Home exercise routine, Type of exercise: walking, Time (Minutes): 20, Frequency (Times/Week): 2, Weekly Exercise (Minutes/Week): 40   Goals Addressed             This Visit's Progress    DIET - INCREASE WATER INTAKE         Depression Screen    03/17/2022    2:34 PM 12/03/2021   11:17 AM 04/30/2021    2:15 PM 04/02/2021    4:15 PM 02/09/2021    2:22 PM 01/21/2021    9:42 AM 01/06/2021   10:04 AM  PHQ 2/9 Scores  PHQ - 2 Score 0 0  1 0 0 2 0  PHQ- 9 Score 0 1 7 0 0 2 1    Fall Risk    03/17/2022    2:38 PM 12/03/2021   11:18 AM 04/30/2021    2:17 PM 04/02/2021    4:15 PM 02/09/2021    2:22 PM  Fall Risk   Falls in the past year? 0 0 '1 1 1  '$ Number falls in past yr: 0 0 0 1 1  Injury with Fall? 0 0 0 1 1  Risk for fall due to : No Fall Risks No Fall Risks History of fall(s) History of fall(s);Impaired balance/gait History of fall(s)  Follow up Falls prevention discussed;Falls evaluation completed Falls evaluation completed Falls evaluation completed Falls evaluation completed Falls evaluation completed    FALL RISK PREVENTION PERTAINING TO THE HOME:  Any stairs in or around the home? No  If so, are there any without handrails? No  Home free of loose throw rugs in walkways, pet beds, electrical cords, etc? Yes  Adequate lighting in your home to reduce risk of falls? Yes   ASSISTIVE DEVICES UTILIZED TO PREVENT FALLS:  Life alert? Yes  Use of a cane, walker or w/c? No  Grab bars in the bathroom? Yes  Shower chair or bench in shower? No  Elevated toilet seat or a handicapped toilet? Yes    Cognitive Function:    06/05/2017    2:50 PM  MMSE - Mini Mental State Exam  Orientation to time 5  Orientation to Place 5  Registration 3  Attention/ Calculation 5  Recall 2  Language- name 2 objects 2  Language- repeat 1  Language- follow 3 step command 3  Language- read & follow direction 1  Write a sentence 1  Copy design 1  Total score 29        03/17/2022    2:44 PM 09/04/2019    2:49 PM 11/17/2016    9:58 AM 10/09/2015    9:32 AM  6CIT Screen  What Year? 4 points 0 points 0 points 0 points  What  month? 3 points 0 points 0 points 0 points  What time? 3 points 0 points 0 points 0 points  Count back from 20 0 points 0 points 0 points 0 points  Months in reverse 0 points 0 points 0 points 0 points  Repeat phrase 10 points 2 points 0 points 0 points  Total Score 20 points 2 points 0 points 0 points     Immunizations Immunization History  Administered Date(s) Administered   Fluad Quad(high Dose 65+) 11/04/2021   Influenza, High Dose Seasonal PF 09/21/2016, 10/24/2017, 11/03/2018   Influenza,inj,Quad PF,6+ Mos 10/20/2014, 10/09/2015   Influenza-Unspecified 10/17/2013, 10/09/2015, 11/03/2018, 10/06/2020   PFIZER(Purple Top)SARS-COV-2 Vaccination 02/18/2019, 03/11/2019   Pneumococcal Conjugate-13 06/06/2014   Pneumococcal Polysaccharide-23 01/19/2012   Tdap 01/18/2010   Zoster Recombinat (Shingrix) 09/05/2019   Zoster, Live 01/19/2012    TDAP status: Due, Education has been provided regarding the importance of this vaccine. Advised may receive this vaccine at local pharmacy or Health Dept. Aware to provide a copy of the vaccination record if obtained from local pharmacy or Health Dept. Verbalized acceptance and understanding.  Flu Vaccine status: Up to date  Pneumococcal vaccine status: Up to date  Covid-19 vaccine status: Completed vaccines  Qualifies for Shingles Vaccine? Yes   Zostavax completed Yes   Shingrix Completed?: No.    Education has been provided regarding the importance of this vaccine. Patient has been advised to call insurance company to determine out of pocket expense if they have not yet received this vaccine. Advised may also receive vaccine at local pharmacy or Health Dept. Verbalized acceptance and understanding.  Screening Tests Health Maintenance  Topic Date Due   COVID-19 Vaccine (3 - Pfizer risk series) 04/08/2019   Zoster Vaccines- Shingrix (2 of 2) 10/31/2019   DTaP/Tdap/Td (2 - Td or Tdap) 01/19/2020   MAMMOGRAM  05/20/2020   Medicare Annual Wellness (AWV)  03/17/2023   Pneumonia Vaccine 39+ Years old  Completed   INFLUENZA VACCINE  Completed   DEXA SCAN  Completed   [employer] Maintenance Due  Topic Date Due   COVID-19 Vaccine (3 - Pfizer risk series) 04/08/2019   Zoster Vaccines- Shingrix (2 of  2) 10/31/2019   DTaP/Tdap/Td (2 - Td or Tdap) 01/19/2020   MAMMOGRAM  05/20/2020    Colorectal cancer screening: No longer required.   Mammogram status: No longer required due to age.  Bone Density status: Completed 10/23/18. Results reflect: Bone density results: OSTEOPOROSIS. Repeat every 2 years.  Lung Cancer Screening: (Low Dose CT Chest recommended if Age 24-80 years, 30 pack-year currently smoking OR have quit w/in 15years.) does not qualify.    Additional Screening:  Hepatitis C Screening: does not qualify; Completed no  Vision Screening: Recommended annual ophthalmology exams for early detection of glaucoma and other disorders of the eye. Is the patient up to date with their annual eye exam?  No  Who is the provider or what is the name of the office in which the patient attends annual eye exams? No one If pt is not established with a provider, would they like to be referred to a provider to establish care? No .   Dental Screening: Recommended annual dental exams for proper oral hygiene  Community Resource Referral / Chronic Care Management: CRR required this visit?  No   CCM required this visit?  No      Plan:     I have personally reviewed and noted  the following in the patient's chart:   Medical and social history Use of alcohol, tobacco or illicit drugs  Current medications and supplements including opioid prescriptions. Patient is not currently taking opioid prescriptions. Functional ability and status Nutritional status Physical activity Advanced directives List of other physicians Hospitalizations, surgeries, and ER visits in previous 12 months Vitals Screenings to include cognitive, depression, and falls Referrals and appointments  In addition, I have reviewed and discussed with patient certain preventive protocols, quality metrics, and best practice recommendations. A written personalized care plan for preventive services as well as general preventive  health recommendations were provided to patient.     Dionisio David, LPN   [id number]   Nurse Notes: none

## 2022-03-17 NOTE — Patient Instructions (Signed)
Evelyn Clements , Thank you for taking time to come for your Medicare Wellness Visit. I appreciate your ongoing commitment to your health goals. Please review the following plan we discussed and let me know if I can assist you in the future.   These are the goals we discussed:  Goals      DIET - INCREASE WATER INTAKE     Have 3 meals a day     Continue to monitor portion sizes and eat at least 3-5 servings of fruits and vegetables per day     Weight (lb) < 200 lb (90.7 kg)     Wants to lose stomach         This is a list of the screening recommended for you and due dates:  Health Maintenance  Topic Date Due   COVID-19 Vaccine (3 - Pfizer risk series) 04/08/2019   Zoster (Shingles) Vaccine (2 of 2) 10/31/2019   DTaP/Tdap/Td vaccine (2 - Td or Tdap) 01/19/2020   Mammogram  05/20/2020   Medicare Annual Wellness Visit  03/17/2023   Pneumonia Vaccine  Completed   Flu Shot  Completed   DEXA scan (bone density measurement)  Completed   HPV Vaccine  Aged Out    Advanced directives: no  Conditions/risks identified: none  Next appointment: Follow up in one year for your annual wellness visit 03/23/23 @ 11:30 am by phone   Preventive Care 65 Years and Older, Female Preventive care refers to lifestyle choices and visits with your health care provider that can promote health and wellness. What does preventive care include? A yearly physical exam. This is also called an annual well check. Dental exams once or twice a year. Routine eye exams. Ask your health care provider how often you should have your eyes checked. Personal lifestyle choices, including: Daily care of your teeth and gums. Regular physical activity. Eating a healthy diet. Avoiding tobacco and drug use. Limiting alcohol use. Practicing safe sex. Taking low-dose aspirin every day. Taking vitamin and mineral supplements as recommended by your health care provider. What happens during an annual well check? The services and  screenings done by your health care provider during your annual well check will depend on your age, overall health, lifestyle risk factors, and family history of disease. Counseling  Your health care provider may ask you questions about your: Alcohol use. Tobacco use. Drug use. Emotional well-being. Home and relationship well-being. Sexual activity. Eating habits. History of falls. Memory and ability to understand (cognition). Work and work Statistician. Reproductive health. Screening  You may have the following tests or measurements: Height, weight, and BMI. Blood pressure. Lipid and cholesterol levels. These may be checked every 5 years, or more frequently if you are over 82 years old. Skin check. Lung cancer screening. You may have this screening every year starting at age 52 if you have a 30-pack-year history of smoking and currently smoke or have quit within the past 15 years. Fecal occult blood test (FOBT) of the stool. You may have this test every year starting at age 73. Flexible sigmoidoscopy or colonoscopy. You may have a sigmoidoscopy every 5 years or a colonoscopy every 10 years starting at age 75. Hepatitis C blood test. Hepatitis B blood test. Sexually transmitted disease (STD) testing. Diabetes screening. This is done by checking your blood sugar (glucose) after you have not eaten for a while (fasting). You may have this done every 1-3 years. Bone density scan. This is done to screen for osteoporosis. You may  have this done starting at age 48. Mammogram. This may be done every 1-2 years. Talk to your health care provider about how often you should have regular mammograms. Talk with your health care provider about your test results, treatment options, and if necessary, the need for more tests. Vaccines  Your health care provider may recommend certain vaccines, such as: Influenza vaccine. This is recommended every year. Tetanus, diphtheria, and acellular pertussis (Tdap,  Td) vaccine. You may need a Td booster every 10 years. Zoster vaccine. You may need this after age 40. Pneumococcal 13-valent conjugate (PCV13) vaccine. One dose is recommended after age 87. Pneumococcal polysaccharide (PPSV23) vaccine. One dose is recommended after age 78. Talk to your health care provider about which screenings and vaccines you need and how often you need them. This information is not intended to replace advice given to you by your health care provider. Make sure you discuss any questions you have with your health care provider. Document Released: 01/30/2015 Document Revised: 09/23/2015 Document Reviewed: 11/04/2014 Elsevier Interactive Patient Education  2017 [employer] Prevention in the Home Falls can cause injuries. They can happen to people of all ages. There are many things you can do to make your home safe and to help prevent falls. What can I do on the outside of my home? Regularly fix the edges of walkways and driveways and fix any cracks. Remove anything that might make you trip as you walk through a door, such as a raised step or threshold. Trim any bushes or trees on the path to your home. Use bright outdoor lighting. Clear any walking paths of anything that might make someone trip, such as rocks or tools. Regularly check to see if handrails are loose or broken. Make sure that both sides of any steps have handrails. Any raised decks and porches should have guardrails on the edges. Have any leaves, snow, or ice cleared regularly. Use sand or salt on walking paths during winter. Clean up any spills in your garage right away. This includes oil or grease spills. What can I do in the bathroom? Use night lights. Install grab bars by the toilet and in the tub and shower. Do not use towel bars as grab bars. Use non-skid mats or decals in the tub or shower. If you need to sit down in the shower, use a plastic, non-slip stool. Keep the floor dry. Clean up any  water that spills on the floor as soon as it happens. Remove soap buildup in the tub or shower regularly. Attach bath mats securely with double-sided non-slip rug tape. Do not have throw rugs and other things on the floor that can make you trip. What can I do in the bedroom? Use night lights. Make sure that you have a light by your bed that is easy to reach. Do not use any sheets or blankets that are too big for your bed. They should not hang down onto the floor. Have a firm chair that has side arms. You can use this for support while you get dressed. Do not have throw rugs and other things on the floor that can make you trip. What can I do in the kitchen? Clean up any spills right away. Avoid walking on wet floors. Keep items that you use a lot in easy-to-reach places. If you need to reach something above you, use a strong step stool that has a grab bar. Keep electrical cords out of the way. Do not use floor polish  or wax that makes floors slippery. If you must use wax, use non-skid floor wax. Do not have throw rugs and other things on the floor that can make you trip. What can I do with my stairs? Do not leave any items on the stairs. Make sure that there are handrails on both sides of the stairs and use them. Fix handrails that are broken or loose. Make sure that handrails are as long as the stairways. Check any carpeting to make sure that it is firmly attached to the stairs. Fix any carpet that is loose or worn. Avoid having throw rugs at the top or bottom of the stairs. If you do have throw rugs, attach them to the floor with carpet tape. Make sure that you have a light switch at the top of the stairs and the bottom of the stairs. If you do not have them, ask someone to add them for you. What else can I do to help prevent falls? Wear shoes that: Do not have high heels. Have rubber bottoms. Are comfortable and fit you well. Are closed at the toe. Do not wear sandals. If you use a  stepladder: Make sure that it is fully opened. Do not climb a closed stepladder. Make sure that both sides of the stepladder are locked into place. Ask someone to hold it for you, if possible. Clearly mark and make sure that you can see: Any grab bars or handrails. First and last steps. Where the edge of each step is. Use tools that help you move around (mobility aids) if they are needed. These include: Canes. Walkers. Scooters. Crutches. Turn on the lights when you go into a dark area. Replace any light bulbs as soon as they burn out. Set up your furniture so you have a clear path. Avoid moving your furniture around. If any of your floors are uneven, fix them. If there are any pets around you, be aware of where they are. Review your medicines with your doctor. Some medicines can make you feel dizzy. This can increase your chance of falling. Ask your doctor what other things that you can do to help prevent falls. This information is not intended to replace advice given to you by your health care provider. Make sure you discuss any questions you have with your health care provider. Document Released: 10/30/2008 Document Revised: 06/11/2015 Document Reviewed: 02/07/2014 Elsevier Interactive Patient Education  2017 [employer].

## 2022-05-12 ENCOUNTER — Other Ambulatory Visit: Payer: Self-pay

## 2022-05-12 ENCOUNTER — Encounter: Payer: Self-pay | Admitting: Intensive Care

## 2022-05-12 ENCOUNTER — Emergency Department
Admission: EM | Admit: 2022-05-12 | Discharge: 2022-05-12 | Discharge status: Against Medical Advice | Payer: No Typology Code available for payment source | Attending: Emergency Medicine | Admitting: Emergency Medicine

## 2022-05-12 DIAGNOSIS — I10 Essential (primary) hypertension: Secondary | ICD-10-CM | POA: Insufficient documentation

## 2022-05-12 DIAGNOSIS — Z5321 Procedure and treatment not carried out due to patient leaving prior to being seen by health care provider: Secondary | ICD-10-CM | POA: Diagnosis not present

## 2022-05-12 LAB — CBC WITH DIFFERENTIAL/PLATELET
Abs Immature Granulocytes: 0.03 10*3/uL (ref 0.00–0.07)
Basophils Absolute: 0 10*3/uL (ref 0.0–0.1)
Basophils Relative: 1 %
Eosinophils Absolute: 0.2 10*3/uL (ref 0.0–0.5)
Eosinophils Relative: 3 %
HCT: 43.7 % (ref 36.0–46.0)
Hemoglobin: 13.9 g/dL (ref 12.0–15.0)
Immature Granulocytes: 0 %
Lymphocytes Relative: 23 %
Lymphs Abs: 1.7 10*3/uL (ref 0.7–4.0)
MCH: 28.6 pg (ref 26.0–34.0)
MCHC: 31.8 g/dL (ref 30.0–36.0)
MCV: 89.9 fL (ref 80.0–100.0)
Monocytes Absolute: 0.6 10*3/uL (ref 0.1–1.0)
Monocytes Relative: 8 %
Neutro Abs: 4.6 10*3/uL (ref 1.7–7.7)
Neutrophils Relative %: 65 %
Platelets: 203 10*3/uL (ref 150–400)
RBC: 4.86 MIL/uL (ref 3.87–5.11)
RDW: 13.9 % (ref 11.5–15.5)
WBC: 7.1 10*3/uL (ref 4.0–10.5)
nRBC: 0 % (ref 0.0–0.2)

## 2022-05-12 LAB — COMPREHENSIVE METABOLIC PANEL
ALT: 19 U/L (ref 0–44)
AST: 26 U/L (ref 15–41)
Albumin: 3.9 g/dL (ref 3.5–5.0)
Alkaline Phosphatase: 83 U/L (ref 38–126)
Anion gap: 7 (ref 5–15)
BUN: 16 mg/dL (ref 8–23)
CO2: 28 mmol/L (ref 22–32)
Calcium: 9 mg/dL (ref 8.9–10.3)
Chloride: 105 mmol/L (ref 98–111)
Creatinine, Ser: 0.79 mg/dL (ref 0.44–1.00)
GFR, Estimated: >60 mL/min (ref >60)
Glucose, Bld: 105 mg/dL — ABNORMAL HIGH (ref 70–99)
Potassium: 3.9 mmol/L (ref 3.5–5.1)
Sodium: 140 mmol/L (ref 135–145)
Total Bilirubin: 0.7 mg/dL (ref 0.3–1.2)
Total Protein: 7.3 g/dL (ref 6.5–8.1)

## 2022-05-12 NOTE — ED Triage Notes (Signed)
Patient presents with hypertension at home. Patient denies any symptoms. Denies pain. No weakness.

## 2022-06-10 ENCOUNTER — Other Ambulatory Visit: Payer: Self-pay | Admitting: Internal Medicine

## 2022-06-10 DIAGNOSIS — K2270 Barrett's esophagus without dysplasia: Secondary | ICD-10-CM

## 2022-06-10 DIAGNOSIS — I10 Essential (primary) hypertension: Secondary | ICD-10-CM

## 2022-06-10 NOTE — Telephone Encounter (Signed)
Requested Prescriptions  Pending Prescriptions Disp Refills   hydrochlorothiazide (HYDRODIURIL) 12.5 MG tablet [Pharmacy Med Name: HYDROCHLOROTHIAZIDE 12.5MG  TABLETS] 90 tablet 0    Sig: TAKE 1 TABLET(12.5 MG) BY MOUTH DAILY     Cardiovascular: Diuretics - Thiazide Failed - 06/10/2022  3:38 AM      Failed - Last BP in normal range    BP Readings from Last 1 Encounters:  02/10/22 (!) 167/83         Passed - Cr in normal range and within 180 days    Creatinine, Ser  Date Value Ref Range Status  05/12/2022 0.79 0.44 - 1.00 mg/dL Final         Passed - K in normal range and within 180 days    Potassium  Date Value Ref Range Status  05/12/2022 3.9 3.5 - 5.1 mmol/L Final         Passed - Na in normal range and within 180 days    Sodium  Date Value Ref Range Status  05/12/2022 140 135 - 145 mmol/L Final  12/03/2021 142 134 - 144 mmol/L Final         Passed - Valid encounter within last 6 months    Recent Outpatient Visits           6 months ago Late onset Alzheimer's dementia with behavioral disturbance (HCC)   [place] [facility] at [facility], Evelyn Cowden, MD   1 year ago 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 coli UTI (urinary tract infection)   [place] [facility] at [facility], Evelyn Cowden, MD   1 year ago Annual physical exam   [facility] at [facility], Evelyn Cowden, MD       Future Appointments             In 2 weeks Evelyn Clements Evelyn Cowden, MD [facility] at [facility] [place]             omeprazole (PRILOSEC) 40 MG capsule  [Pharmacy Med Name: OMEPRAZOLE 40MG  CAPSULES] 90 capsule 0    Sig: TAKE 1 CAPSULE(40 MG) BY MOUTH DAILY     Gastroenterology: Proton Pump Inhibitors Passed - 06/10/2022  3:38 AM      Passed - Valid encounter within last 12 months    Recent Outpatient Visits           6 months ago Late onset Alzheimer's dementia with behavioral disturbance (HCC)   [place] [facility] at [facility], Evelyn Cowden, MD   1 year ago 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 coli UTI (urinary tract infection)   [place] [facility] at [facility], Evelyn Cowden, MD   1 year ago Annual physical exam  [place] [facility] at [facility], Evelyn Cowden, MD       Future Appointments             In 2 weeks Evelyn Clements Evelyn Cowden, MD [facility] at [facility], [facility]             diltiazem (CARDIZEM CD) 120 MG 24 hr capsule [Pharmacy Med Name: DILTIAZEM CD 120MG  CAPSULES (24 HR)] 90 capsule 0    Sig: TAKE 1 CAPSULE(120 MG) BY MOUTH DAILY     Cardiovascular: Calcium Channel Blockers 3 Failed - 06/10/2022  3:38 AM      Failed - Last BP in normal range    BP Readings from Last 1 Encounters:  02/10/22 (!) 167/83         Passed - ALT in normal range and within 360 days    ALT  Date Value Ref Range Status  05/12/2022 19 0 - 44 U/L Final         Passed - AST in normal range and within 360 days    AST  Date Value Ref Range Status  05/12/2022 26 15 - 41 U/L Final         Passed - Cr in normal range and within 360 days    Creatinine, Ser  Date Value Ref Range Status  05/12/2022 0.79 0.44 -  1.00 mg/dL Final         Passed - Last Heart Rate in normal range    Pulse Readings from Last 1 Encounters:  02/10/22 80         Passed - Valid encounter within last 6 months    Recent Outpatient Visits           6 months ago Late onset Alzheimer's dementia with behavioral disturbance (HCC)   [place] [facility] at [facility], Evelyn Cowden, MD   1 year ago 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 coli UTI (urinary tract infection)   [place] [facility] at [facility], Evelyn Cowden, MD   1 year ago Annual physical exam   [facility] at [facility], Evelyn Cowden, MD       Future Appointments             In 2 weeks Evelyn Clements, Evelyn Cowden, MD [facility] at [facility], [facility]

## 2022-06-10 NOTE — Telephone Encounter (Signed)
Called pt/daughter. Spoke with daughter. Scheduled follow up appt.

## 2022-06-28 ENCOUNTER — Ambulatory Visit (INDEPENDENT_AMBULATORY_CARE_PROVIDER_SITE_OTHER): Payer: No Typology Code available for payment source | Admitting: Internal Medicine

## 2022-06-28 ENCOUNTER — Encounter: Payer: Self-pay | Admitting: Internal Medicine

## 2022-06-28 VITALS — BP 128/72 | HR 72 | Ht 60.0 in | Wt 167.4 lb

## 2022-06-28 DIAGNOSIS — K2270 Barrett's esophagus without dysplasia: Secondary | ICD-10-CM | POA: Diagnosis not present

## 2022-06-28 DIAGNOSIS — Z1231 Encounter for screening mammogram for malignant neoplasm of breast: Secondary | ICD-10-CM

## 2022-06-28 DIAGNOSIS — G301 Alzheimer's disease with late onset: Secondary | ICD-10-CM

## 2022-06-28 DIAGNOSIS — F02818 Dementia in other diseases classified elsewhere, unspecified severity, with other behavioral disturbance: Secondary | ICD-10-CM

## 2022-06-28 DIAGNOSIS — I10 Essential (primary) hypertension: Secondary | ICD-10-CM | POA: Diagnosis not present

## 2022-06-28 DIAGNOSIS — I700 Atherosclerosis of aorta: Secondary | ICD-10-CM

## 2022-06-28 NOTE — Patient Instructions (Signed)
Call [facility] to schedule your mammogram in [place] at [phone].

## 2022-06-28 NOTE — Assessment & Plan Note (Signed)
Stable exam with well controlled BP.  Currently taking hctz and diltiazem. Tolerating medications without concerns or side effects. Will continue to recommend low sodium diet and current regimen.

## 2022-06-28 NOTE — Assessment & Plan Note (Signed)
Intolerant of statin therapy Lab Results  Component Value Date   LDLCALC 184 (H) 12/03/2021  Will continue dietary measures only.
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
## 2022-06-28 NOTE — Assessment & Plan Note (Signed)
No recent declined concerns Has not had follow up with Neurology recently She continues on Namenda, Seroquel and Sertraline. She is in a safe living environment with good support.

## 2022-06-28 NOTE — Assessment & Plan Note (Signed)
Reflux symptoms are minimal on current therapy - omeprazole. No red flag signs such as weight loss, n/v, melena Last EGD 2017 Barretts

## 2022-08-01 ENCOUNTER — Ambulatory Visit
Admission: RE | Admit: 2022-08-01 | Discharge: 2022-08-01 | Disposition: A | Discharge status: Home / Self Care | Payer: No Typology Code available for payment source | Source: Ambulatory Visit | Attending: Internal Medicine | Admitting: Internal Medicine

## 2022-08-01 DIAGNOSIS — Z1231 Encounter for screening mammogram for malignant neoplasm of breast: Secondary | ICD-10-CM | POA: Insufficient documentation

## 2022-09-13 ENCOUNTER — Other Ambulatory Visit: Payer: Self-pay | Admitting: Internal Medicine

## 2022-09-13 DIAGNOSIS — K2270 Barrett's esophagus without dysplasia: Secondary | ICD-10-CM

## 2022-09-13 DIAGNOSIS — I10 Essential (primary) hypertension: Secondary | ICD-10-CM

## 2022-09-13 NOTE — Telephone Encounter (Signed)
Requested Prescriptions  Pending Prescriptions Disp Refills   hydrochlorothiazide (HYDRODIURIL) 12.5 MG tablet [Pharmacy Med Name: HYDROCHLOROTHIAZIDE 12.5MG  TABLETS] 90 tablet 1    Sig: TAKE 1 TABLET(12.5 MG) BY MOUTH DAILY     Cardiovascular: Diuretics - Thiazide Passed - 09/13/2022  3:37 AM      Passed - Cr in normal range and within 180 days    Creatinine, Ser  Date Value Ref Range Status  05/12/2022 0.79 0.44 - 1.00 mg/dL Final         Passed - K in normal range and within 180 days    Potassium  Date Value Ref Range Status  05/12/2022 3.9 3.5 - 5.1 mmol/L Final         Passed - Na in normal range and within 180 days    Sodium  Date Value Ref Range Status  05/12/2022 140 135 - 145 mmol/L Final  12/03/2021 142 134 - 144 mmol/L Final         Passed - Last BP in normal range    BP Readings from Last 1 Encounters:  06/28/22 128/72         Passed - Valid encounter within last 6 months    Recent Outpatient Visits           2 months ago Essential (primary) hypertension   [place] [facility] at [facility], Nyoka Cowden, MD   9 months ago Late onset Alzheimer's dementia with behavioral disturbance [facility])   [place] [facility] at [facility], Nyoka Cowden, MD   1 year ago 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 coli UTI (urinary tract infection)   [place] [facility] at [facility], Nyoka Cowden, MD       Future Appointments             In 2 months Judithann Graves Nyoka Cowden, MD [facility] at [facility]             diltiazem (CARDIZEM CD) 120 MG  24 hr capsule [Pharmacy Med Name: DILTIAZEM CD 120MG  CAPSULES (24 HR)] 90 capsule 1    Sig: TAKE 1 CAPSULE(120 MG) BY MOUTH DAILY     Cardiovascular: Calcium Channel Blockers 3 Passed - 09/13/2022  3:37 AM      Passed - ALT in normal range and within 360 days    ALT  Date Value Ref Range Status  05/12/2022 19 0 - 44 U/L Final         Passed - AST in normal range and within 360 days    AST  Date Value Ref Range Status  05/12/2022 26 15 - 41 U/L Final         Passed - Cr in normal range and within 360 days    Creatinine, Ser  Date Value Ref Range Status  05/12/2022 0.79 0.44 - 1.00 mg/dL Final         Passed - Last BP in normal range    BP Readings from Last 1 Encounters:  06/28/22 128/72         Passed - Last Heart  Rate in normal range    Pulse Readings from Last 1 Encounters:  06/28/22 72         Passed - Valid encounter within last 6 months    Recent Outpatient Visits           2 months ago Essential (primary) hypertension   [place] [facility] at [facility], Nyoka Cowden, MD   9 months ago Late onset Alzheimer's dementia with behavioral disturbance [facility])   [place] [facility] at [facility], Nyoka Cowden, MD   1 year ago 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 coli UTI (urinary tract infection)   [place] [facility] at [facility], Nyoka Cowden, MD       Future Appointments             In 2 months Judithann Graves Nyoka Cowden, MD [facility] at [facility]             omeprazole (PRILOSEC) 40 MG capsule [Pharmacy Med Name: OMEPRAZOLE 40MG   CAPSULES] 90 capsule 1    Sig: TAKE 1 CAPSULE(40 MG) BY MOUTH DAILY     Gastroenterology: Proton Pump Inhibitors Passed - 09/13/2022  3:37 AM      Passed - Valid encounter within last 12 months    Recent Outpatient Visits           2 months ago Essential (primary) hypertension   [place] [facility] at [facility], Nyoka Cowden, MD   9 months ago Late onset Alzheimer's dementia with behavioral disturbance [facility])   [place] [facility] at [facility], Nyoka Cowden, MD   1 year ago 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 coli UTI (urinary tract infection)   [place] [facility] at [facility], Nyoka Cowden, MD       Future Appointments             In 2 months Judithann Graves, Nyoka Cowden, MD [facility] at [facility], [facility]

## 2022-10-13 DIAGNOSIS — Z8659 Personal history of other mental and behavioral disorders: Secondary | ICD-10-CM | POA: Diagnosis not present

## 2022-10-13 DIAGNOSIS — F02818 Dementia in other diseases classified elsewhere, unspecified severity, with other behavioral disturbance: Secondary | ICD-10-CM | POA: Diagnosis not present

## 2022-10-13 DIAGNOSIS — G301 Alzheimer's disease with late onset: Secondary | ICD-10-CM | POA: Diagnosis not present

## 2022-10-13 DIAGNOSIS — Z79899 Other long term (current) drug therapy: Secondary | ICD-10-CM | POA: Diagnosis not present

## 2022-11-24 DIAGNOSIS — Z8659 Personal history of other mental and behavioral disorders: Secondary | ICD-10-CM | POA: Diagnosis not present

## 2022-11-24 DIAGNOSIS — F02818 Dementia in other diseases classified elsewhere, unspecified severity, with other behavioral disturbance: Secondary | ICD-10-CM | POA: Diagnosis not present

## 2022-11-24 DIAGNOSIS — G301 Alzheimer's disease with late onset: Secondary | ICD-10-CM | POA: Diagnosis not present

## 2022-11-29 ENCOUNTER — Encounter: Payer: Self-pay | Admitting: Internal Medicine

## 2022-11-29 ENCOUNTER — Ambulatory Visit (INDEPENDENT_AMBULATORY_CARE_PROVIDER_SITE_OTHER): Payer: No Typology Code available for payment source | Admitting: Internal Medicine

## 2022-11-29 VITALS — BP 108/82 | HR 70 | Ht 60.0 in | Wt 171.0 lb

## 2022-11-29 DIAGNOSIS — R7303 Prediabetes: Secondary | ICD-10-CM

## 2022-11-29 DIAGNOSIS — E782 Mixed hyperlipidemia: Secondary | ICD-10-CM | POA: Diagnosis not present

## 2022-11-29 DIAGNOSIS — S90511A Abrasion, right ankle, initial encounter: Secondary | ICD-10-CM | POA: Diagnosis not present

## 2022-11-29 DIAGNOSIS — F02818 Dementia in other diseases classified elsewhere, unspecified severity, with other behavioral disturbance: Secondary | ICD-10-CM

## 2022-11-29 DIAGNOSIS — H6123 Impacted cerumen, bilateral: Secondary | ICD-10-CM

## 2022-11-29 DIAGNOSIS — Z23 Encounter for immunization: Secondary | ICD-10-CM

## 2022-11-29 DIAGNOSIS — G301 Alzheimer's disease with late onset: Secondary | ICD-10-CM | POA: Diagnosis not present

## 2022-11-29 DIAGNOSIS — I10 Essential (primary) hypertension: Secondary | ICD-10-CM

## 2022-11-29 NOTE — Assessment & Plan Note (Signed)
Controlled BP with normal exam. Current regimen is hydrochlorothiazide and diltiazem. Will continue same medications; encourage continued reduced sodium diet.

## 2022-11-29 NOTE — Assessment & Plan Note (Signed)
Patient intolerant of statins and zetia Continue low fat diet

## 2022-11-29 NOTE — Assessment & Plan Note (Addendum)
Recently seen by Neurology - Namenda increased to bid and Sertraline increased to 100 mg. Daughter requests FMLA and letter to the [facility]
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
## 2022-11-30 LAB — COMPREHENSIVE METABOLIC PANEL
ALT: 20 [IU]/L (ref 0–32)
AST: 21 [IU]/L (ref 0–40)
Albumin: 4.3 g/dL (ref 3.7–4.7)
Alkaline Phosphatase: 96 [IU]/L (ref 44–121)
BUN/Creatinine Ratio: 16 (ref 12–28)
BUN: 16 mg/dL (ref 8–27)
Bilirubin Total: 0.3 mg/dL (ref 0.0–1.2)
CO2: 25 mmol/L (ref 20–29)
Calcium: 9.4 mg/dL (ref 8.7–10.3)
Chloride: 105 mmol/L (ref 96–106)
Creatinine, Ser: 1.02 mg/dL — ABNORMAL HIGH (ref 0.57–1.00)
Globulin, Total: 2.5 g/dL (ref 1.5–4.5)
Glucose: 114 mg/dL — ABNORMAL HIGH (ref 70–99)
Potassium: 3.9 mmol/L (ref 3.5–5.2)
Sodium: 145 mmol/L — ABNORMAL HIGH (ref 134–144)
Total Protein: 6.8 g/dL (ref 6.0–8.5)
eGFR: 54 mL/min/{1.73_m2} — ABNORMAL LOW (ref >59)

## 2022-11-30 LAB — TSH: TSH: 3.78 u[IU]/mL (ref 0.450–4.500)

## 2022-11-30 LAB — CBC WITH DIFFERENTIAL/PLATELET
Basophils Absolute: 0.1 10*3/uL (ref 0.0–0.2)
Basos: 1 %
EOS (ABSOLUTE): 0.2 10*3/uL (ref 0.0–0.4)
Eos: 3 %
Hematocrit: 41.5 % (ref 34.0–46.6)
Hemoglobin: 13.2 g/dL (ref 11.1–15.9)
Immature Grans (Abs): 0 10*3/uL (ref 0.0–0.1)
Immature Granulocytes: 0 %
Lymphocytes Absolute: 1.6 10*3/uL (ref 0.7–3.1)
Lymphs: 18 %
MCH: 28.1 pg (ref 26.6–33.0)
MCHC: 31.8 g/dL (ref 31.5–35.7)
MCV: 88 fL (ref 79–97)
Monocytes Absolute: 0.8 10*3/uL (ref 0.1–0.9)
Monocytes: 9 %
Neutrophils Absolute: 6.1 10*3/uL (ref 1.4–7.0)
Neutrophils: 69 %
Platelets: 198 10*3/uL (ref 150–450)
RBC: 4.7 x10E6/uL (ref 3.77–5.28)
RDW: 13.7 % (ref 11.7–15.4)
WBC: 8.8 10*3/uL (ref 3.4–10.8)

## 2022-11-30 LAB — HEMOGLOBIN A1C
Est. average glucose Bld gHb Est-mCnc: 134 mg/dL
Hgb A1c MFr Bld: 6.3 % — ABNORMAL HIGH (ref 4.8–5.6)

## 2022-11-30 LAB — LIPID PANEL
Chol/HDL Ratio: 5.6 ratio — ABNORMAL HIGH (ref 0.0–4.4)
Cholesterol, Total: 311 mg/dL — ABNORMAL HIGH (ref 100–199)
HDL: 56 mg/dL (ref >39)
LDL Chol Calc (NIH): 219 mg/dL — ABNORMAL HIGH (ref 0–99)
Triglycerides: 187 mg/dL — ABNORMAL HIGH (ref 0–149)
VLDL Cholesterol Cal: 36 mg/dL (ref 5–40)

## 2022-12-05 DIAGNOSIS — H6123 Impacted cerumen, bilateral: Secondary | ICD-10-CM | POA: Diagnosis not present

## 2022-12-05 DIAGNOSIS — H9203 Otalgia, bilateral: Secondary | ICD-10-CM | POA: Diagnosis not present

## 2022-12-05 DIAGNOSIS — H903 Sensorineural hearing loss, bilateral: Secondary | ICD-10-CM | POA: Diagnosis not present

## 2023-01-06 DIAGNOSIS — F03B3 Unspecified dementia, moderate, with mood disturbance: Secondary | ICD-10-CM | POA: Diagnosis not present

## 2023-01-06 DIAGNOSIS — I129 Hypertensive chronic kidney disease with stage 1 through stage 4 chronic kidney disease, or unspecified chronic kidney disease: Secondary | ICD-10-CM | POA: Diagnosis not present

## 2023-01-06 DIAGNOSIS — Z9183 Wandering in diseases classified elsewhere: Secondary | ICD-10-CM | POA: Diagnosis not present

## 2023-01-06 DIAGNOSIS — F03B4 Unspecified dementia, moderate, with anxiety: Secondary | ICD-10-CM | POA: Diagnosis not present

## 2023-01-06 DIAGNOSIS — F03B18 Unspecified dementia, moderate, with other behavioral disturbance: Secondary | ICD-10-CM | POA: Diagnosis not present

## 2023-01-06 DIAGNOSIS — R2681 Unsteadiness on feet: Secondary | ICD-10-CM | POA: Diagnosis not present

## 2023-01-06 DIAGNOSIS — N1831 Chronic kidney disease, stage 3a: Secondary | ICD-10-CM | POA: Diagnosis not present

## 2023-01-06 DIAGNOSIS — R7303 Prediabetes: Secondary | ICD-10-CM | POA: Diagnosis not present

## 2023-01-06 DIAGNOSIS — Z008 Encounter for other general examination: Secondary | ICD-10-CM | POA: Diagnosis not present

## 2023-01-06 DIAGNOSIS — Z6837 Body mass index (BMI) 37.0-37.9, adult: Secondary | ICD-10-CM | POA: Diagnosis not present

## 2023-01-12 ENCOUNTER — Telehealth: Payer: Self-pay | Admitting: Internal Medicine

## 2023-01-12 NOTE — Telephone Encounter (Signed)
Copied from CRM [id number]. Topic: General - Other >> Jan 12, 2023  3:19 PM Ja-Kwan M wrote: Reason for CRM: Lowella Bandy with [employer] reports that pt daughter informed them that the pt needs someone to sit with the pt for 3-4 hours daily and that is not offered with home health care services. Cb# [phone] Ext. [phone]

## 2023-02-27 DIAGNOSIS — Z79899 Other long term (current) drug therapy: Secondary | ICD-10-CM | POA: Diagnosis not present

## 2023-02-27 DIAGNOSIS — G301 Alzheimer's disease with late onset: Secondary | ICD-10-CM | POA: Diagnosis not present

## 2023-02-27 DIAGNOSIS — F02818 Dementia in other diseases classified elsewhere, unspecified severity, with other behavioral disturbance: Secondary | ICD-10-CM | POA: Diagnosis not present

## 2023-03-08 ENCOUNTER — Other Ambulatory Visit: Payer: Self-pay | Admitting: Student

## 2023-03-08 DIAGNOSIS — G301 Alzheimer's disease with late onset: Secondary | ICD-10-CM

## 2023-03-21 ENCOUNTER — Ambulatory Visit
Admission: RE | Admit: 2023-03-21 | Discharge: 2023-03-21 | Disposition: A | Discharge status: Home / Self Care | Payer: No Typology Code available for payment source | Source: Ambulatory Visit | Attending: Student | Admitting: Student

## 2023-03-21 DIAGNOSIS — G301 Alzheimer's disease with late onset: Secondary | ICD-10-CM | POA: Insufficient documentation

## 2023-03-21 DIAGNOSIS — G309 Alzheimer's disease, unspecified: Secondary | ICD-10-CM | POA: Diagnosis not present

## 2023-03-21 DIAGNOSIS — F0280 Dementia in other diseases classified elsewhere without behavioral disturbance: Secondary | ICD-10-CM | POA: Diagnosis not present

## 2023-03-21 DIAGNOSIS — I6782 Cerebral ischemia: Secondary | ICD-10-CM | POA: Diagnosis not present

## 2023-03-21 DIAGNOSIS — F02818 Dementia in other diseases classified elsewhere, unspecified severity, with other behavioral disturbance: Secondary | ICD-10-CM | POA: Insufficient documentation

## 2023-03-24 ENCOUNTER — Other Ambulatory Visit: Payer: Self-pay | Admitting: Internal Medicine

## 2023-03-24 DIAGNOSIS — K2270 Barrett's esophagus without dysplasia: Secondary | ICD-10-CM

## 2023-03-24 DIAGNOSIS — I10 Essential (primary) hypertension: Secondary | ICD-10-CM

## 2023-03-24 NOTE — Telephone Encounter (Signed)
 Requested Prescriptions  Pending Prescriptions Disp Refills   hydrochlorothiazide (HYDRODIURIL) 12.5 MG tablet [Pharmacy Med Name: HYDROCHLOROTHIAZIDE 12.5MG  TABLETS] 90 tablet 1    Sig: TAKE 1 TABLET(12.5 MG) BY MOUTH DAILY     Cardiovascular: Diuretics - Thiazide Failed - 03/24/2023  1:30 PM      Failed - Cr in normal range and within 180 days    Creatinine, Ser  Date Value Ref Range Status  11/29/2022 1.02 (H) 0.57 - 1.00 mg/dL Final         Failed - Na in normal range and within 180 days    Sodium  Date Value Ref Range Status  11/29/2022 145 (H) 134 - 144 mmol/L Final         Passed - K in normal range and within 180 days    Potassium  Date Value Ref Range Status  11/29/2022 3.9 3.5 - 5.2 mmol/L Final         Passed - Last BP in normal range    BP Readings from Last 1 Encounters:  11/29/22 108/82         Passed - Valid encounter within last 6 months    Recent Outpatient Visits           3 months ago Essential (primary) hypertension   [place] [facility] at [facility], Nyoka Cowden, MD   8 months ago Essential (primary) hypertension   [place] [facility] at [facility], Nyoka Cowden, MD   1 year ago Late onset Alzheimer's dementia with behavioral disturbance [facility])   [place] [facility] at [facility], Nyoka Cowden, MD   1 year ago 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 [facility] at [facility], Nyoka Cowden, MD       Future Appointments             In 2 months Judithann Graves Nyoka Cowden, MD [facility] at [facility] [place]             omeprazole (PRILOSEC) 40 MG capsule [Pharmacy Med Name: OMEPRAZOLE  40MG  CAPSULES] 90 capsule 1    Sig: TAKE 1 CAPSULE(40 MG) BY MOUTH DAILY     Gastroenterology: Proton Pump Inhibitors Passed - 03/24/2023  1:30 PM      Passed - Valid encounter within last 12 months    Recent Outpatient Visits           3 months ago Essential (primary) hypertension   [place] [facility] at [facility], Nyoka Cowden, MD   8 months ago Essential (primary) hypertension   [place] [facility] at [facility], Nyoka Cowden, MD   1 year ago Late onset Alzheimer's dementia with behavioral disturbance [facility])   [place] [facility] at [facility], Nyoka Cowden, MD   1 year ago Moderate late onset Alzheimer's dementia with mood disturbance [facility])   [place] [facility] at [facility], Nyoka Cowden, MD   1 year ago Late onset Alzheimer's dementia with behavioral disturbance [facility])   [facility] at [facility], Vernona Rieger  H, MD       Future Appointments             In 2 months Judithann Graves, Nyoka Cowden, MD [facility] at [facility], [facility]             diltiazem (CARDIZEM CD) 120 MG 24 hr capsule [Pharmacy Med Name: DILTIAZEM CD 120MG  CAPSULES (24 HR)] 90 capsule 1    Sig: TAKE 1 CAPSULE(120 MG) BY MOUTH DAILY     Cardiovascular: Calcium Channel Blockers 3 Failed - 03/24/2023  1:30 PM      Failed - Cr in normal range and within 360 days    Creatinine, Ser  Date Value Ref Range Status  11/29/2022 1.02 (H) 0.57 - 1.00 mg/dL Final         Passed - ALT in normal range and within 360 days    ALT  Date Value Ref Range Status  11/29/2022 20 0 - 32 IU/L Final         Passed - AST in normal range and within 360 days    AST  Date Value Ref Range Status  11/29/2022 21 0 - 40 IU/L Final         Passed - Last BP in normal range    BP Readings from Last 1 Encounters:  11/29/22 108/82          Passed - Last Heart Rate in normal range    Pulse Readings from Last 1 Encounters:  11/29/22 70         Passed - Valid encounter within last 6 months    Recent Outpatient Visits           3 months ago Essential (primary) hypertension   [place] [facility] at [facility], Nyoka Cowden, MD   8 months ago Essential (primary) hypertension   [place] [facility] at [facility], Nyoka Cowden, MD   1 year ago Late onset Alzheimer's dementia with behavioral disturbance [facility])   [place] [facility] at [facility], Nyoka Cowden, MD   1 year ago 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

## 2023-04-06 ENCOUNTER — Ambulatory Visit: Payer: Self-pay | Admitting: Emergency Medicine

## 2023-04-06 VITALS — Ht 61.0 in | Wt 180.0 lb

## 2023-04-06 DIAGNOSIS — Z0000 Encounter for general adult medical examination without abnormal findings: Secondary | ICD-10-CM | POA: Diagnosis not present

## 2023-04-06 NOTE — Patient Instructions (Addendum)
 Evelyn Clements , Thank you for taking time to come for your Medicare Wellness Visit. I appreciate your ongoing commitment to your health goals. Please review the following plan we discussed and let me know if I can assist you in the future.   Referrals/Orders/Follow-Ups/Clinician Recommendations: Try to get an extra walk in each day.   This is a list of the screening recommended for you and due dates:  Health Maintenance  Topic Date Due   COVID-19 Vaccine (3 - Pfizer risk series) 04/08/2019   Zoster (Shingles) Vaccine (2 of 2) 10/31/2019   Mammogram  08/01/2023   Medicare Annual Wellness Visit  04/05/2024   DTaP/Tdap/Td vaccine (3 - Td or Tdap) 11/28/2032   Pneumonia Vaccine  Completed   Flu Shot  Completed   DEXA scan (bone density measurement)  Completed   HPV Vaccine  Aged Out    Advanced directives: (Copy Requested) Please bring a copy of your health care power of attorney and living will to the office to be added to your chart at your convenience. You can mail to [facility] [address] [place], [place] [address] or email to [email or web address]  Next Medicare Annual Wellness Visit scheduled for next year: Yes, 04/18/24 @ 1:50pm (phone visit)  Fall Prevention in the Home, Adult Falls can cause injuries and affect people of all ages. There are many simple things that you can do to make your home safe and to help prevent falls. If you need it, ask for help making these changes. What actions can I take to prevent falls? General information Use good lighting in all rooms. Make sure to: Replace any light bulbs that burn out. Turn on lights if it is dark and use night-lights. Keep items that you use often in easy-to-reach places. Lower the shelves around your home if needed. Move furniture so that there are clear paths around it. Do not keep throw rugs or other things on the floor that can make you trip. If any of your floors are uneven, fix them. Add color or  contrast paint or tape to clearly mark and help you see: Grab bars or handrails. First and last steps of staircases. Where the edge of each step is. If you use a ladder or stepladder: Make sure that it is fully opened. Do not climb a closed ladder. Make sure the sides of the ladder are locked in place. Have someone hold the ladder while you use it. Know where your pets are as you move through your home. What can I do in the bathroom?     Keep the floor dry. Clean up any water that is on the floor right away. Remove soap buildup in the bathtub or shower. Buildup makes bathtubs and showers slippery. Use non-skid mats or decals on the floor of the bathtub or shower. Attach bath mats securely with double-sided, non-slip rug tape. If you need to sit down while you are in the shower, use a non-slip stool. Install grab bars by the toilet and in the bathtub and shower. Do not use towel bars as grab bars. What can I do in the bedroom? Make sure that you have a light by your bed that is easy to reach. Do not use any sheets or blankets on your bed that hang to the floor. Have a firm bench or chair with side arms that you can use for support when you get dressed. What can I do in the kitchen? Clean up any spills right  away. If you need to reach something above you, use a sturdy step stool that has a grab bar. Keep electrical cables out of the way. Do not use floor polish or wax that makes floors slippery. What can I do with my stairs? Do not leave anything on the stairs. Make sure that you have a light switch at the top and the bottom of the stairs. Have them installed if you do not have them. Make sure that there are handrails on both sides of the stairs. Fix handrails that are broken or loose. Make sure that handrails are as long as the staircases. Install non-slip stair treads on all stairs in your home if they do not have carpet. Avoid having throw rugs at the top or bottom of stairs, or  secure the rugs with carpet tape to prevent them from moving. Choose a carpet design that does not hide the edge of steps on the stairs. Make sure that carpet is firmly attached to the stairs. Fix any carpet that is loose or worn. What can I do on the outside of my home? Use bright outdoor lighting. Repair the edges of walkways and driveways and fix any cracks. Clear paths of anything that can make you trip, such as tools or rocks. Add color or contrast paint or tape to clearly mark and help you see high doorway thresholds. Trim any bushes or trees on the main path into your home. Check that handrails are securely fastened and in good repair. Both sides of all steps should have handrails. Install guardrails along the edges of any raised decks or porches. Have leaves, snow, and ice cleared regularly. Use sand, salt, or ice melt on walkways during winter months if you live where there is ice and snow. In the garage, clean up any spills right away, including grease or oil spills. What other actions can I take? Review your medicines with your health care provider. Some medicines can make you confused or feel dizzy. This can increase your chance of falling. Wear closed-toe shoes that fit well and support your feet. Wear shoes that have rubber soles and low heels. Use a cane, walker, scooter, or crutches that help you move around if needed. Talk with your provider about other ways that you can decrease your risk of falls. This may include seeing a physical therapist to learn to do exercises to improve movement and strength. Where to find more information Centers for Disease Control and Prevention, [employer]: TonerPromos.no [employer] on Aging: BaseRingTones.pl National Institute on Aging: BaseRingTones.pl Contact a health care provider if: You are afraid of falling at home. You feel weak, drowsy, or dizzy at home. You fall at home. Get help right away if you: Lose consciousness or have trouble moving after a  fall. Have a fall that causes a head injury. These symptoms may be an emergency. Get help right away. Call 911. Do not wait to see if the symptoms will go away. Do not drive yourself to the hospital. This information is not intended to replace advice given to you by your health care provider. Make sure you discuss any questions you have with your health care provider. Document Revised: 09/06/2021 Document Reviewed: 09/06/2021 Elsevier Patient Education  2024 [employer].

## 2023-04-06 NOTE — Progress Notes (Signed)
 Subjective:   Evelyn Clements is a 88 y.o. who presents for a Medicare Wellness preventive visit.  Visit Complete: Virtual I connected with  Evelyn Clements on 04/06/23 by a audio enabled telemedicine application and verified that I am speaking with the correct person using two identifiers.  Patient Location: Other:  [facility]  Provider Location: Home Office  I discussed the limitations of evaluation and management by telemedicine. The patient expressed understanding and agreed to proceed.  Vital Signs: Because this visit was a virtual/telehealth visit, some criteria may be missing or patient reported. Any vitals not documented were not able to be obtained and vitals that have been documented are patient reported.  VideoDeclined- This patient declined Librarian, academic. Therefore the visit was completed with audio only.  Persons Participating in Visit: Patient assisted by Evelyn Clements, daughter.  AWV Questionnaire: No: Patient Medicare AWV questionnaire was not completed prior to this visit.  Cardiac Risk Factors include: advanced age (>40men, >44 women);dyslipidemia;hypertension;obesity (BMI >30kg/m2);sedentary lifestyle     Objective:    Today's Vitals   04/06/23 1341  Weight: 180 lb (81.6 kg)  Height: 5\' 1"  (1.549 m)   Body mass index is 34.01 kg/m.     04/06/2023    1:59 PM 05/12/2022   12:44 PM 03/17/2022    2:37 PM 01/06/2021   10:05 AM 01/01/2021    9:14 AM 09/04/2019    2:45 PM 08/29/2018    2:25 PM  Advanced Directives  Does Patient Have a Medical Advance Directive? Yes No No Yes Unable to assess, patient is non-responsive or altered mental status Yes Yes  Type of Public librarian Power of [place];Living will   Healthcare Power of [place];Living will  Healthcare Power of [place];Living will Healthcare Power of [place];Living will  Does patient want to make changes to medical advance directive? No - Patient  declined        Copy of Healthcare Power of Attorney in Chart? No - copy requested   Yes - validated most recent copy scanned in chart (See row information)  Yes - validated most recent copy scanned in chart (See row information) Yes - validated most recent copy scanned in chart (See row information)  Would patient like information on creating a medical advance directive?  No - Patient declined No - Patient declined        Current Medications (verified) Outpatient Encounter Medications as of 04/06/2023  Medication Sig   diltiazem (CARDIZEM CD) 120 MG 24 hr capsule TAKE 1 CAPSULE(120 MG) BY MOUTH DAILY   hydrochlorothiazide (HYDRODIURIL) 12.5 MG tablet TAKE 1 TABLET(12.5 MG) BY MOUTH DAILY   meclizine (ANTIVERT) 25 MG tablet Take 1 tablet (25 mg total) by mouth 2 (two) times daily as needed for dizziness.   memantine (NAMENDA) 5 MG tablet Take 5 mg by mouth 2 (two) times daily.   omeprazole (PRILOSEC) 40 MG capsule TAKE 1 CAPSULE(40 MG) BY MOUTH DAILY   sertraline (ZOLOFT) 100 MG tablet Take 50 mg by mouth daily.   Calcium Carb-Cholecalciferol (CALCIUM + D3) 600-200 MG-UNIT TABS Take 1 tablet by mouth daily. (Patient not taking: Reported on 04/06/2023)   LUTEIN PO Take by mouth. (Patient not taking: Reported on 04/06/2023)   [DISCONTINUED] memantine (NAMENDA) 5 MG tablet Take by mouth. (Patient not taking: Reported on 04/06/2023)   No facility-administered encounter medications on file as of 04/06/2023.    Allergies (verified) Zolpidem tartrate   History: Past Medical History:  Diagnosis Date   Breast  cancer (HCC) 1980   right breast ca   GERD (gastroesophageal reflux disease)    Hypercholesteremia    Hypertension    Mild cognitive impairment    seen by neurology   Past Surgical History:  Procedure Laterality Date   ABDOMINAL HYSTERECTOMY  1990   Total   APPENDECTOMY     BREAST EXCISIONAL BIOPSY Right 1980's   lumpectomy lobular ca pt stated no chemo no rad   BUNIONECTOMY Right     CATARACT EXTRACTION W/ INTRAOCULAR LENS  IMPLANT, BILATERAL     CESAREAN SECTION     ESOPHAGOGASTRODUODENOSCOPY  2013   Barrett's esophagus   ESOPHAGOGASTRODUODENOSCOPY (EGD) WITH PROPOFOL N/A 12/14/2015   Procedure: ESOPHAGOGASTRODUODENOSCOPY (EGD) WITH PROPOFOL;  Surgeon: Midge Minium, MD;  Location: [facility];  Service: Endoscopy;  Laterality: N/A;   TONSILLECTOMY     Family History  Problem Relation Age of Onset   Leukemia Father    Uterine cancer Mother    Breast cancer Neg Hx    Social History   Socioeconomic History   Marital status: Widowed    Spouse name: Not on file   Number of children: 5   Years of education: Not on file   Highest education level: Not on file  Occupational History   Occupation: retired    Comment: realtor  Tobacco Use   Smoking status: Never    Passive exposure: Never   Smokeless tobacco: Never  Vaping Use   Vaping status: Never Used  Substance and Sexual Activity   Alcohol use: No    Alcohol/week: 0.0 standard drinks of alcohol   Drug use: No   Sexual activity: Never  Other Topics Concern   Not on file  Social History Narrative   Pt moved here from [place], daughter lives in [place]. Pt currently lives alone in a retirement community   Social Drivers of Health   Financial Resource Strain: High Risk (04/06/2023)   Overall Financial Resource Strain (CARDIA)    Difficulty of Paying Living Expenses: Hard  Food Insecurity: No Food Insecurity (04/06/2023)   Hunger Vital Sign    Worried About Running Out of Food in the Last Year: Never true    Ran Out of Food in the Last Year: Never true  Transportation Needs: No Transportation Needs (04/06/2023)   PRAPARE - Administrator, Civil Service (Medical): No    Lack of Transportation (Non-Medical): No  Physical Activity: Inactive (04/06/2023)   Exercise Vital Sign    Days of Exercise per Week: 0 days    Minutes of Exercise per Session: 0 min  Stress: No Stress Concern  Present (04/06/2023)   [employer] of Occupational Health - Occupational Stress Questionnaire    Feeling of Stress : Not at all  Social Connections: Moderately Isolated (04/06/2023)   Social Connection and Isolation Panel [NHANES]    Frequency of Communication with Friends and Family: More than three times a week    Frequency of Social Gatherings with Friends and Family: More than three times a week    Attends Religious Services: More than 4 times per year    Active Member of [employer] or Organizations: No    Attends Banker Meetings: Never    Marital Status: Widowed    Tobacco Counseling Counseling given: Not Answered    Clinical Intake:  Pre-visit preparation completed: Yes  Pain : No/denies pain     BMI - recorded: 34.01 Nutritional Risks: None Diabetes: No  Lab Results  Component  Value Date   HGBA1C 6.3 (H) 11/29/2022   HGBA1C 6.4 (H) 12/03/2021   HGBA1C 6.2 (H) 01/21/2021     How often do you need to have someone help you when you read instructions, pamphlets, or other written materials from your doctor or pharmacy?: 1 - Never  Interpreter Needed?: No  Information entered by :: Evelyn Clements, CMA   Activities of Daily Living     04/06/2023    1:46 PM  In your present state of health, do you have any difficulty performing the following activities:  Hearing? 0  Vision? 0  Difficulty concentrating or making decisions? 1  Comment Alzheimer's  Walking or climbing stairs? 0  Dressing or bathing? 1  Comment personal aid helps patient  Doing errands, shopping? 1  Comment doesn't drive, daughter takes to appointments  Preparing Food and eating ? Y  Comment cannot prepare food  Using the Toilet? N  In the past six months, have you accidently leaked urine? Y  Comment wears depends  Do you have problems with loss of bowel control? N  Managing your Medications? Y  Comment patient's daughter fills medication dispener  Managing your Finances? Y   Comment daughter Neurosurgeon or managing your Housekeeping? Y  Comment personal care aid helps    Patient Care Team: Reubin Milan, MD as PCP - General (Internal Medicine) Lonell Face, MD as Consulting Physician (Neurology) Marcina Millard, MD as Consulting Physician (Cardiology)  Indicate any recent Medical Services you may have received from other than [facility] providers in the past year (date may be approximate).     Assessment:   This is a routine wellness examination for Evelyn Clements.  Hearing/Vision screen Hearing Screening - Comments:: Denies hearing loss Vision Screening - Comments:: Declined eye exam due to age and Alzheimer's   Goals Addressed             This Visit's Progress    Patient Stated       Take an extra walk per day     COMPLETED: Weight (lb) < 200 lb (90.7 kg)   180 lb (81.6 kg)    Wants to lose stomach        Depression Screen     04/06/2023    1:55 PM 11/29/2022   10:11 AM 06/28/2022    3:09 PM 03/17/2022    2:34 PM 12/03/2021   11:17 AM 04/30/2021    2:15 PM 04/02/2021    4:15 PM  PHQ 2/9 Scores  PHQ - 2 Score  0 0 0 0 1 0  PHQ- 9 Score  2 4 0 1 7 0  Exception Documentation Other- indicate reason in comment box          Fall Risk     04/06/2023    2:03 PM 11/29/2022   10:11 AM 06/28/2022    3:09 PM 03/17/2022    2:38 PM 12/03/2021   11:18 AM  Fall Risk   Falls in the past year? 1 1 0 0 0  Number falls in past yr: 1 0 0 0 0  Injury with Fall? 1 0 0 0 0  Risk for fall due to : History of fall(s);Impaired balance/gait;Orthopedic patient;Mental status change History of fall(s) No Fall Risks No Fall Risks No Fall Risks  Follow up Falls prevention discussed;Falls evaluation completed;Education provided Falls evaluation completed Falls evaluation completed Falls prevention discussed;Falls evaluation completed Falls evaluation completed    MEDICARE RISK AT HOME:  Medicare Risk at  Home Any stairs in or around the home?:  No If so, are there any without handrails?: No Home free of loose throw rugs in walkways, pet beds, electrical cords, etc?: Yes Adequate lighting in your home to reduce risk of falls?: Yes Life alert?: Yes Use of a cane, walker or w/c?: No Grab bars in the bathroom?: Yes Shower chair or bench in shower?: Yes Elevated toilet seat or a handicapped toilet?: Yes  TIMED UP AND GO:  Was the test performed?  No  Cognitive Function: 6CIT completed    06/05/2017    2:50 PM  MMSE - Mini Mental State Exam  Orientation to time 5  Orientation to Place 5  Registration 3  Attention/ Calculation 5  Recall 2  Language- name 2 objects 2  Language- repeat 1  Language- follow 3 step command 3  Language- read & follow direction 1  Write a sentence 1  Copy design 1  Total score 29        04/06/2023    2:05 PM 03/17/2022    2:44 PM 09/04/2019    2:49 PM 11/17/2016    9:58 AM 10/09/2015    9:32 AM  6CIT Screen  What Year? 4 points 4 points 0 points 0 points 0 points  What month? 3 points 3 points 0 points 0 points 0 points  What time? 3 points 3 points 0 points 0 points 0 points  Count back from 20 0 points 0 points 0 points 0 points 0 points  Months in reverse 4 points 0 points 0 points 0 points 0 points  Repeat phrase 8 points 10 points 2 points 0 points 0 points  Total Score 22 points 20 points 2 points 0 points 0 points    Immunizations Immunization History  Administered Date(s) Administered   Fluad Quad(high Dose 65+) 11/04/2021   Fluad Trivalent(High Dose 65+) 11/29/2022   Influenza, High Dose Seasonal PF 09/21/2016, 10/24/2017, 11/03/2018   Influenza,inj,Quad PF,6+ Mos 10/20/2014, 10/09/2015   Influenza-Unspecified 10/17/2013, 10/09/2015, 11/03/2018, 10/06/2020   PFIZER(Purple Top)SARS-COV-2 Vaccination 02/18/2019, 03/11/2019   Pneumococcal Conjugate-13 06/06/2014   Pneumococcal Polysaccharide-23 01/19/2012   Tdap 01/18/2010, 11/29/2022   Zoster Recombinant(Shingrix) 09/05/2019    Zoster, Live 01/19/2012    Screening Tests Health Maintenance  Topic Date Due   COVID-19 Vaccine (3 - Pfizer risk series) 04/08/2019   Zoster Vaccines- Shingrix (2 of 2) 10/31/2019   MAMMOGRAM  08/01/2023   Medicare Annual Wellness (AWV)  04/05/2024   DTaP/Tdap/Td (3 - Td or Tdap) 11/28/2032   Pneumonia Vaccine 25+ Years old  Completed   INFLUENZA VACCINE  Completed   DEXA SCAN  Completed   HPV VACCINES  Aged Out    Health Maintenance  Health Maintenance Due  Topic Date Due   COVID-19 Vaccine (3 - Pfizer risk series) 04/08/2019   Zoster Vaccines- Shingrix (2 of 2) 10/31/2019   Health Maintenance Items Addressed: See Nurse Notes  Additional Screening:  Vision Screening: Recommended annual ophthalmology exams for early detection of glaucoma and other disorders of the eye.  Dental Screening: Recommended annual dental exams for proper oral hygiene  Community Resource Referral / Chronic Care Management: CRR required this visit?  No   CCM required this visit?  No     Plan:     I have personally reviewed and noted the following in the patient's chart:   Medical and social history Use of alcohol, tobacco or illicit drugs  Current medications and supplements including opioid prescriptions. Patient is not  currently taking opioid prescriptions. Functional ability and status Nutritional status Physical activity Advanced directives List of other physicians Hospitalizations, surgeries, and ER visits in previous 12 months Vitals Screenings to include cognitive, depression, and falls Referrals and appointments  In addition, I have reviewed and discussed with patient certain preventive protocols, quality metrics, and best practice recommendations. A written personalized care plan for preventive services as well as general preventive health recommendations were provided to patient.     Evelyn Clements, CMA   04/06/2023   After Visit Summary: (MyChart) Due to this being a  telephonic visit, the after visit summary with patients personalized plan was offered to patient via MyChart   Notes:  Evelyn Clements, daughter was present during today's visit 6 CIT Score - 22 Needs 2nd shingles vaccine Plans to get Covid booster in the fall Declined MMG, DEXA and colon due to age

## 2023-05-29 ENCOUNTER — Encounter: Payer: Self-pay | Admitting: Internal Medicine

## 2023-05-29 ENCOUNTER — Ambulatory Visit (INDEPENDENT_AMBULATORY_CARE_PROVIDER_SITE_OTHER): Payer: Self-pay | Admitting: Internal Medicine

## 2023-05-29 VITALS — BP 126/70 | HR 73 | Ht 61.0 in | Wt 168.4 lb

## 2023-05-29 DIAGNOSIS — G301 Alzheimer's disease with late onset: Secondary | ICD-10-CM

## 2023-05-29 DIAGNOSIS — R7303 Prediabetes: Secondary | ICD-10-CM | POA: Diagnosis not present

## 2023-05-29 DIAGNOSIS — Z0000 Encounter for general adult medical examination without abnormal findings: Secondary | ICD-10-CM

## 2023-05-29 DIAGNOSIS — F02818 Dementia in other diseases classified elsewhere, unspecified severity, with other behavioral disturbance: Secondary | ICD-10-CM

## 2023-05-29 DIAGNOSIS — K2270 Barrett's esophagus without dysplasia: Secondary | ICD-10-CM

## 2023-05-29 DIAGNOSIS — I10 Essential (primary) hypertension: Secondary | ICD-10-CM
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
## 2023-05-29 NOTE — Assessment & Plan Note (Signed)
 Blood pressure is well controlled.  Current medications are hydrochlorothiazide  and diltiazem . Will continue same regimen along with efforts to limit dietary sodium.

## 2023-05-29 NOTE — Assessment & Plan Note (Signed)
 Continue efforts to limit carbohydrates and sweets. Weight is stable or down a few pounds Lab Results  Component Value Date   HGBA1C 6.3 (H) 11/29/2022

## 2023-05-29 NOTE — Assessment & Plan Note (Signed)
 Taking Sertraline and Namenda Followed by Neurology

## 2023-05-29 NOTE — Assessment & Plan Note (Addendum)
 Reflux symptoms are controlled on daily PPI. Hx of Barretts - last EGD 2017 by Dr. Ole Berkeley. Due to age, would not recommend further EGD unless symptoms change. Patient denies red flag symptoms - no melena, weight loss, dysphagia.

## 2023-05-30 ENCOUNTER — Ambulatory Visit: Payer: Self-pay | Admitting: Internal Medicine

## 2023-05-30 LAB — COMPREHENSIVE METABOLIC PANEL WITH GFR
ALT: 23 IU/L (ref 0–32)
AST: 26 IU/L (ref 0–40)
Albumin: 4.3 g/dL (ref 3.7–4.7)
Alkaline Phosphatase: 99 IU/L (ref 44–121)
BUN/Creatinine Ratio: 21 (ref 12–28)
BUN: 24 mg/dL (ref 8–27)
Bilirubin Total: 0.2 mg/dL (ref 0.0–1.2)
CO2: 24 mmol/L (ref 20–29)
Calcium: 10 mg/dL (ref 8.7–10.3)
Chloride: 105 mmol/L (ref 96–106)
Creatinine, Ser: 1.13 mg/dL — ABNORMAL HIGH (ref 0.57–1.00)
Globulin, Total: 2.2 g/dL (ref 1.5–4.5)
Glucose: 99 mg/dL (ref 70–99)
Potassium: 3.8 mmol/L (ref 3.5–5.2)
Sodium: 143 mmol/L (ref 134–144)
Total Protein: 6.5 g/dL (ref 6.0–8.5)
eGFR: 47 mL/min/{1.73_m2} — ABNORMAL LOW (ref >59)

## 2023-05-30 LAB — HEMOGLOBIN A1C
Est. average glucose Bld gHb Est-mCnc: 137 mg/dL
Hgb A1c MFr Bld: 6.4 % — ABNORMAL HIGH (ref 4.8–5.6)

## 2023-08-04 DIAGNOSIS — S52532A Colles' fracture of left radius, initial encounter for closed fracture: Secondary | ICD-10-CM | POA: Diagnosis not present

## 2023-08-06 DIAGNOSIS — S52532A Colles' fracture of left radius, initial encounter for closed fracture: Secondary | ICD-10-CM | POA: Diagnosis not present

## 2023-08-10 DIAGNOSIS — F0390 Unspecified dementia without behavioral disturbance: Secondary | ICD-10-CM | POA: Diagnosis not present

## 2023-08-10 DIAGNOSIS — M25512 Pain in left shoulder: Secondary | ICD-10-CM | POA: Diagnosis not present

## 2023-08-10 DIAGNOSIS — S62525A Nondisplaced fracture of distal phalanx of left thumb, initial encounter for closed fracture: Secondary | ICD-10-CM | POA: Diagnosis not present

## 2023-08-10 DIAGNOSIS — M75112 Incomplete rotator cuff tear or rupture of left shoulder, not specified as traumatic: Secondary | ICD-10-CM | POA: Diagnosis not present

## 2023-08-10 DIAGNOSIS — S52532A Colles' fracture of left radius, initial encounter for closed fracture: Secondary | ICD-10-CM | POA: Diagnosis not present

## 2023-08-28 DIAGNOSIS — S52532A Colles' fracture of left radius, initial encounter for closed fracture: Secondary | ICD-10-CM | POA: Diagnosis not present

## 2023-09-21 DIAGNOSIS — S52592D Other fractures of lower end of left radius, subsequent encounter for closed fracture with routine healing: Secondary | ICD-10-CM | POA: Diagnosis not present

## 2023-09-21 DIAGNOSIS — Z4789 Encounter for other orthopedic aftercare: Secondary | ICD-10-CM | POA: Diagnosis not present

## 2023-09-21 DIAGNOSIS — S52532A Colles' fracture of left radius, initial encounter for closed fracture: Secondary | ICD-10-CM | POA: Diagnosis not present

## 2023-10-19 DIAGNOSIS — S52532D Colles' fracture of left radius, subsequent encounter for closed fracture with routine healing: Secondary | ICD-10-CM | POA: Diagnosis not present

## 2023-11-29 ENCOUNTER — Encounter: Payer: Self-pay | Admitting: Internal Medicine

## 2023-11-29 ENCOUNTER — Ambulatory Visit (INDEPENDENT_AMBULATORY_CARE_PROVIDER_SITE_OTHER): Admitting: Internal Medicine

## 2023-11-29 VITALS — BP 118/64 | HR 70 | Ht 61.0 in | Wt 161.0 lb

## 2023-11-29 DIAGNOSIS — Z23 Encounter for immunization: Secondary | ICD-10-CM

## 2023-11-29 DIAGNOSIS — K2270 Barrett's esophagus without dysplasia: Secondary | ICD-10-CM | POA: Diagnosis not present

## 2023-11-29 DIAGNOSIS — R7303 Prediabetes: Secondary | ICD-10-CM

## 2023-11-29 DIAGNOSIS — F02818 Dementia in other diseases classified elsewhere, unspecified severity, with other behavioral disturbance: Secondary | ICD-10-CM

## 2023-11-29 DIAGNOSIS — I10 Essential (primary) hypertension: Secondary | ICD-10-CM | POA: Diagnosis not present

## 2023-11-29 DIAGNOSIS — M25512 Pain in left shoulder: Secondary | ICD-10-CM

## 2023-11-29 DIAGNOSIS — G301 Alzheimer's disease with late onset: Secondary | ICD-10-CM

## 2023-11-29 DIAGNOSIS — G8929 Other chronic pain: Secondary | ICD-10-CM | POA: Insufficient documentation

## 2023-11-29 MED ORDER — MELOXICAM 15 MG PO TABS: 15.0000 mg | ORAL_TABLET | Freq: Every day | ORAL | 1 refills | Status: AC

## 2023-11-29 MED ORDER — HYDROCHLOROTHIAZIDE 12.5 MG PO TABS: 12.5000 mg | ORAL_TABLET | Freq: Every day | ORAL | 1 refills | Status: AC

## 2023-11-29 MED ORDER — DILTIAZEM HCL ER COATED BEADS 120 MG PO CP24: 120.0000 mg | ORAL_CAPSULE | Freq: Every day | ORAL | 1 refills | Status: AC

## 2023-11-29 NOTE — Patient Instructions (Signed)
 Try to increase fluids and water to 3 bottles per day.  Can stop the daily aspirin.  Monitor for stomach or intestinal issues while taking Meloxicam .

## 2023-11-29 NOTE — Assessment & Plan Note (Signed)
 Continue on Namenda and Sertraline - by Neurology, Some more frequent agitation lately - mostly able to be redirected. Recommend follow up with Neurology for medication management

## 2023-11-29 NOTE — Assessment & Plan Note (Signed)
 Seen by Orthopedics and started on Mobic  daily with excellent benefit. Daughter cautioned to monitor for any GI side effects

## 2023-11-29 NOTE — Assessment & Plan Note (Signed)
 Only taking omeprazole  every third day due to concern over Namenda interaction Hx of Barrett's without dysplasia - no further evaluation recommended unless symptoms develop

## 2023-11-29 NOTE — Addendum Note (Signed)
 Addended by: CLAUDIS EUAL SAILOR on: 11/29/2023 04:10 PM   Modules accepted: Orders

## 2023-11-29 NOTE — Assessment & Plan Note (Signed)
 Well controlled blood pressure today. Current regimen is hydrochlorothiazide  and diltiazem . No medication side effects noted.

## 2023-11-29 NOTE — Assessment & Plan Note (Signed)
 She continues on a healthy diet, has lost 7 more lbs since moving in with her daughter.
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
## 2023-11-30 ENCOUNTER — Ambulatory Visit: Payer: Self-pay | Admitting: Internal Medicine

## 2023-11-30 LAB — CBC WITH DIFFERENTIAL/PLATELET
Basophils Absolute: 0.1 x10E3/uL (ref 0.0–0.2)
Basos: 1 %
EOS (ABSOLUTE): 0.2 x10E3/uL (ref 0.0–0.4)
Eos: 3 %
Hematocrit: 39.8 % (ref 34.0–46.6)
Hemoglobin: 12.5 g/dL (ref 11.1–15.9)
Immature Grans (Abs): 0 x10E3/uL (ref 0.0–0.1)
Immature Granulocytes: 0 %
Lymphocytes Absolute: 1.5 x10E3/uL (ref 0.7–3.1)
Lymphs: 20 %
MCH: 26 pg — ABNORMAL LOW (ref 26.6–33.0)
MCHC: 31.4 g/dL — ABNORMAL LOW (ref 31.5–35.7)
MCV: 83 fL (ref 79–97)
Monocytes Absolute: 0.7 x10E3/uL (ref 0.1–0.9)
Monocytes: 9 %
Neutrophils Absolute: 5 x10E3/uL (ref 1.4–7.0)
Neutrophils: 67 %
Platelets: 240 x10E3/uL (ref 150–450)
RBC: 4.8 x10E6/uL (ref 3.77–5.28)
RDW: 14.3 % (ref 11.7–15.4)
WBC: 7.5 x10E3/uL (ref 3.4–10.8)

## 2023-11-30 LAB — BASIC METABOLIC PANEL WITH GFR
BUN/Creatinine Ratio: 19 (ref 12–28)
BUN: 20 mg/dL (ref 8–27)
CO2: 23 mmol/L (ref 20–29)
Calcium: 9.5 mg/dL (ref 8.7–10.3)
Chloride: 104 mmol/L (ref 96–106)
Creatinine, Ser: 1.04 mg/dL — ABNORMAL HIGH (ref 0.57–1.00)
Glucose: 94 mg/dL (ref 70–99)
Potassium: 4.3 mmol/L (ref 3.5–5.2)
Sodium: 143 mmol/L (ref 134–144)
eGFR: 52 mL/min/1.73 — ABNORMAL LOW (ref >59)

## 2023-11-30 LAB — TSH: TSH: 3.74 u[IU]/mL (ref 0.450–4.500)

## 2023-11-30 LAB — HEMOGLOBIN A1C
Est. average glucose Bld gHb Est-mCnc: 128 mg/dL
Hgb A1c MFr Bld: 6.1 % — ABNORMAL HIGH (ref 4.8–5.6)

## 2023-12-02 LAB — URINE CULTURE

## 2024-01-01 ENCOUNTER — Emergency Department
Admission: EM | Admit: 2024-01-01 | Discharge: 2024-01-01 | Disposition: A | Discharge status: Home / Self Care | Attending: Emergency Medicine | Admitting: Emergency Medicine

## 2024-01-01 ENCOUNTER — Emergency Department

## 2024-01-01 ENCOUNTER — Other Ambulatory Visit: Payer: Self-pay

## 2024-01-01 DIAGNOSIS — R0789 Other chest pain: Secondary | ICD-10-CM | POA: Diagnosis present

## 2024-01-01 DIAGNOSIS — S20212A Contusion of left front wall of thorax, initial encounter: Secondary | ICD-10-CM

## 2024-01-01 DIAGNOSIS — Y92009 Unspecified place in unspecified non-institutional (private) residence as the place of occurrence of the external cause: Secondary | ICD-10-CM | POA: Diagnosis not present

## 2024-01-01 DIAGNOSIS — F0390 Unspecified dementia without behavioral disturbance: Secondary | ICD-10-CM

## 2024-01-01 DIAGNOSIS — I10 Essential (primary) hypertension: Secondary | ICD-10-CM | POA: Diagnosis not present

## 2024-01-01 DIAGNOSIS — Z853 Personal history of malignant neoplasm of breast: Secondary | ICD-10-CM | POA: Diagnosis not present

## 2024-01-01 DIAGNOSIS — W010XXA Fall on same level from slipping, tripping and stumbling without subsequent striking against object, initial encounter: Secondary | ICD-10-CM | POA: Diagnosis not present

## 2024-01-01 LAB — CBC WITH DIFFERENTIAL/PLATELET
Abs Immature Granulocytes: 0.03 K/uL (ref 0.00–0.07)
Basophils Absolute: 0.1 K/uL (ref 0.0–0.1)
Basophils Relative: 1 %
Eosinophils Absolute: 0.3 K/uL (ref 0.0–0.5)
Eosinophils Relative: 4 %
HCT: 41.4 % (ref 36.0–46.0)
Hemoglobin: 12.9 g/dL (ref 12.0–15.0)
Immature Granulocytes: 0 %
Lymphocytes Relative: 17 %
Lymphs Abs: 1.3 K/uL (ref 0.7–4.0)
MCH: 26.4 pg (ref 26.0–34.0)
MCHC: 31.2 g/dL (ref 30.0–36.0)
MCV: 84.7 fL (ref 80.0–100.0)
Monocytes Absolute: 0.6 K/uL (ref 0.1–1.0)
Monocytes Relative: 8 %
Neutro Abs: 5.5 K/uL (ref 1.7–7.7)
Neutrophils Relative %: 70 %
Platelets: 180 K/uL (ref 150–400)
RBC: 4.89 MIL/uL (ref 3.87–5.11)
RDW: 16.7 % — ABNORMAL HIGH (ref 11.5–15.5)
WBC: 7.8 K/uL (ref 4.0–10.5)
nRBC: 0 % (ref 0.0–0.2)

## 2024-01-01 LAB — BASIC METABOLIC PANEL WITH GFR
Anion gap: 12 (ref 5–15)
BUN: 19 mg/dL (ref 8–23)
CO2: 25 mmol/L (ref 22–32)
Calcium: 9.8 mg/dL (ref 8.9–10.3)
Chloride: 106 mmol/L (ref 98–111)
Creatinine, Ser: 0.86 mg/dL (ref 0.44–1.00)
GFR, Estimated: >60 mL/min (ref >60)
Glucose, Bld: 112 mg/dL — ABNORMAL HIGH (ref 70–99)
Potassium: 3.8 mmol/L (ref 3.5–5.1)
Sodium: 143 mmol/L (ref 135–145)

## 2024-01-01 MED ORDER — TRAMADOL HCL 50 MG PO TABS
25.0000 mg | ORAL_TABLET | Freq: Once | ORAL | Status: AC
  Administered 2024-01-01: 13:00:00 25 mg via ORAL
  Filled 2024-01-01: qty 1

## 2024-01-01 MED ORDER — ACETAMINOPHEN 500 MG PO TABS
1000.0000 mg | ORAL_TABLET | Freq: Once | ORAL | Status: AC
  Administered 2024-01-01: 13:00:00 1000 mg via ORAL
  Filled 2024-01-01: qty 2

## 2024-01-01 MED ORDER — TRAMADOL HCL 50 MG PO TABS: 25.0000 mg | ORAL_TABLET | Freq: Four times a day (QID) | ORAL | 0 refills | Status: AC | PRN

## 2024-01-01 NOTE — ED Triage Notes (Signed)
 Pt came in via EMS from home due to fall she has Thursday. Pt complaining of left sided rib pain this morning, no deformities noted, family wanted her to come in and get checked out. Pt has dementia at baseline. History of hypertension, pt did not take medications this morning. 2L [place]. Pt in no acute distress at this time.

## 2024-01-01 NOTE — ED Notes (Signed)
 Bed alarm on, socks on, stretcher locked and in the lowest position, call bell with in reach

## 2024-01-01 NOTE — ED Provider Notes (Signed)
 [facility] Provider Note    Event Date/Time   First MD Initiated Contact with Patient 01/01/24 1039     (approximate)   History   Chief Complaint: Fall   HPI  Evelyn Clements is a 88 y.o. female with a history of hypertension, GERD, dementia who is brought to the ED due to a fall at home that occurred 4 days ago.  Patient was in her usual state of health, being helped to bed by her nighttime nurse when she tripped over the rug, fell into an elliptical machine.  Seemed to be okay at the time, but now is reporting left-sided chest pain.  No other complaints.        Past Medical History:  Diagnosis Date   Breast cancer [facility]) 1980   right breast ca   GERD (gastroesophageal reflux disease)    Hypercholesteremia    Hypertension    Mild cognitive impairment    seen by neurology    Current Outpatient Rx   Order #: [id number] Class: Normal   Order #: [id number] Class: Normal   Order #: [id number] Class: Normal   Order #: [id number] Class: Normal   Order #: [id number] Class: Normal   Order #: [id number] Class: Historical Med   Order #: [id number] Class: Normal   Order #: [id number] Class: Historical Med    Past Surgical History:  Procedure Laterality Date   ABDOMINAL HYSTERECTOMY  1990   Total   APPENDECTOMY     BREAST EXCISIONAL BIOPSY Right 1980's   lumpectomy lobular ca pt stated no chemo no rad   BUNIONECTOMY Right    CATARACT EXTRACTION W/ INTRAOCULAR LENS  IMPLANT, BILATERAL     CESAREAN SECTION     ESOPHAGOGASTRODUODENOSCOPY  2013   Barrett's esophagus   ESOPHAGOGASTRODUODENOSCOPY (EGD) WITH PROPOFOL  N/A 12/14/2015   Procedure: ESOPHAGOGASTRODUODENOSCOPY (EGD) WITH PROPOFOL ;  Surgeon: Rogelia Copping, MD;  Location: [facility];  Service: Endoscopy;  Laterality: N/A;   TONSILLECTOMY      Physical Exam   Triage Vital Signs: ED Triage Vitals [01/01/24 1035]  Encounter Vitals Group     BP      Girls Systolic BP Percentile      Girls Diastolic BP  Percentile      Boys Systolic BP Percentile      Boys Diastolic BP Percentile      Pulse      Resp      Temp      Temp src      SpO2      Weight      Height      Head Circumference      Peak Flow      Pain Score 0     Pain Loc      Pain Education      Exclude from Growth Chart     Most recent vital signs: Vitals:   01/01/24 1041  BP: (!) 169/64  Pulse: 67  Resp: 20  Temp: 98.5 F (36.9 C)  SpO2: 93%    General: Awake, no distress.  CV:  Good peripheral perfusion.  Regular rate rhythm Resp:  Normal effort.  Clear lungs Abd:  No distention.  Soft nontender Other:  Tenderness over left anterior chest wall.  No crepitus or obvious deformity.  No bruising.  Head is atraumatic.  No midline spinal tenderness.  There is soft tissue tenderness in the left low back.   ED Results / Procedures / Treatments   Labs (all labs ordered are listed, but only abnormal  results are displayed) Labs Reviewed  BASIC METABOLIC PANEL WITH GFR - Abnormal; Notable for the following components:      Result Value   Glucose, Bld 112 (*)    All other components within normal limits  CBC WITH DIFFERENTIAL/PLATELET - Abnormal; Notable for the following components:   RDW 16.7 (*)    All other components within normal limits     EKG Interpreted by me Sinus rhythm rate of 67.  Normal axis and intervals.  Poor R wave progression.  No acute ischemic changes.   RADIOLOGY Chest x-ray interpreted by me, negative for pneumothorax or obvious rib fracture.  Radiology report reviewed   PROCEDURES:  Procedures   MEDICATIONS ORDERED IN ED: Medications  acetaminophen  (TYLENOL ) tablet 1,000 mg (has no administration in time range)  traMADol  (ULTRAM ) tablet 25 mg (has no administration in time range)     IMPRESSION / MDM / ASSESSMENT AND PLAN / ED COURSE  I reviewed the triage vital signs and the nursing notes.  DDx: Rib fracture, pneumothorax, pleural effusion, anemia, electrolyte  derangement  Patient's presentation is most consistent with acute presentation with potential threat to life or bodily function.  Patient presents with likely mechanical fall sustaining left chest wall blunt trauma.  Has symptoms of contusion versus rib fracture.  Will obtain labs, chest x-ray.   ----------------------------------------- 12:56 PM on 01/01/2024 ----------------------------------------- Workup reassuring.  Presentation consistent with contusion from blunt chest/thorax trauma from a mechanical fall.  Stable for discharge, Tylenol , heating pad, low-dose tramadol  and primary care follow-up.      FINAL CLINICAL IMPRESSION(S) / ED DIAGNOSES   Final diagnoses:  Chest wall contusion, left, initial encounter  Chronic dementia (HCC)     Rx / DC Orders   ED Discharge Orders          Ordered    traMADol  (ULTRAM ) 50 MG tablet  Every 6 hours PRN        01/01/24 1255             Note:  This document was prepared using Dragon voice recognition software and may include unintentional dictation errors.   Viviann Pastor, MD 01/01/24 1257

## 2024-01-12 ENCOUNTER — Other Ambulatory Visit: Payer: Self-pay | Admitting: Internal Medicine

## 2024-01-12 DIAGNOSIS — I10 Essential (primary) hypertension: Secondary | ICD-10-CM

## 2024-01-15 NOTE — Telephone Encounter (Signed)
 Too soon for refill, [facility] 11/29/23 FOR 90 AND 1 RF.  Requested Prescriptions  Pending Prescriptions Disp Refills   diltiazem  (CARDIZEM  CD) 120 MG 24 hr capsule [Pharmacy Med Name: DILTIAZEM  CD 120MG  CAPSULES (24 HR)] 90 capsule 1    Sig: TAKE 1 CAPSULE(120 MG) BY MOUTH DAILY     Cardiovascular: Calcium Channel Blockers 3 Failed - 01/15/2024 12:01 PM      Failed - Last BP in normal range    BP Readings from Last 1 Encounters:  01/01/24 (!) 169/64         Passed - ALT in normal range and within 360 days    ALT  Date Value Ref Range Status  05/29/2023 23 0 - 32 IU/L Final         Passed - AST in normal range and within 360 days    AST  Date Value Ref Range Status  05/29/2023 26 0 - 40 IU/L Final         Passed - Cr in normal range and within 360 days    Creatinine, Ser  Date Value Ref Range Status  01/01/2024 0.86 0.44 - 1.00 mg/dL Final         Passed - Last Heart Rate in normal range    Pulse Readings from Last 1 Encounters:  01/01/24 67         Passed - Valid encounter within last 6 months    Recent Outpatient Visits           1 month ago Essential (primary) hypertension   [place] [facility] at [facility], Leita DEL, MD   7 months ago Annual physical exam   [facility] at [facility], Leita DEL, MD               hydrochlorothiazide  (HYDRODIURIL ) 12.5 MG tablet [Pharmacy Med Name: HYDROCHLOROTHIAZIDE  12.5MG  TABLETS] 90 tablet 1    Sig: TAKE 1 TABLET(12.5 MG) BY MOUTH DAILY     Cardiovascular: Diuretics - Thiazide Failed - 01/15/2024 12:01 PM      Failed - Last BP in normal range    BP Readings from Last 1 Encounters:  01/01/24 (!) 169/64         Passed - Cr in normal range and within 180 days    Creatinine, Ser  Date Value Ref Range Status  01/01/2024 0.86 0.44 - 1.00 mg/dL Final         Passed - K in normal range and within 180 days    Potassium  Date Value Ref Range  Status  01/01/2024 3.8 3.5 - 5.1 mmol/L Final         Passed - Na in normal range and within 180 days    Sodium  Date Value Ref Range Status  01/01/2024 143 135 - 145 mmol/L Final  11/29/2023 143 134 - 144 mmol/L Final         Passed - Valid encounter within last 6 months    Recent Outpatient Visits           1 month ago Essential (primary) hypertension   [place] [facility] at [facility], Leita DEL, MD   7 months ago Annual physical exam   [facility] at [facility], Leita DEL, MD

## 2024-04-18 ENCOUNTER — Ambulatory Visit

## 2024-05-29 ENCOUNTER — Encounter: Admitting: Student
